# Patient Record
Sex: Male | Born: 1960 | Race: White | Hispanic: No | Marital: Married | Smoking: Former smoker
Health system: Southern US, Community
[De-identification: ages and names within clinical notes are randomized; demographics above are authoritative.]

## PROBLEM LIST (undated history)

## (undated) DIAGNOSIS — I2510 Atherosclerotic heart disease of native coronary artery without angina pectoris: Secondary | ICD-10-CM

## (undated) DIAGNOSIS — I10 Essential (primary) hypertension: Secondary | ICD-10-CM

## (undated) DIAGNOSIS — I4891 Unspecified atrial fibrillation: Secondary | ICD-10-CM

## (undated) HISTORY — PX: CARDIAC CATHETERIZATION: SHX172

---

## 2008-01-03 ENCOUNTER — Emergency Department (HOSPITAL_BASED_OUTPATIENT_CLINIC_OR_DEPARTMENT_OTHER): Admission: EM | Admit: 2008-01-03 | Discharge: 2008-01-03 | Payer: Self-pay | Admitting: Emergency Medicine

## 2008-02-25 ENCOUNTER — Ambulatory Visit (HOSPITAL_COMMUNITY): Admission: RE | Admit: 2008-02-25 | Discharge: 2008-02-25 | Payer: Self-pay | Admitting: Urology

## 2010-05-08 ENCOUNTER — Inpatient Hospital Stay (HOSPITAL_COMMUNITY): Admission: EM | Admit: 2010-05-08 | Discharge: 2010-05-10 | Payer: Self-pay | Admitting: Emergency Medicine

## 2010-05-09 ENCOUNTER — Encounter (INDEPENDENT_AMBULATORY_CARE_PROVIDER_SITE_OTHER): Payer: Self-pay | Admitting: Cardiology

## 2010-07-26 ENCOUNTER — Encounter: Payer: Self-pay | Admitting: Urology

## 2010-09-14 LAB — CBC
HCT: 50.4 % (ref 39.0–52.0)
HCT: 51 % (ref 39.0–52.0)
MCH: 29.7 pg (ref 26.0–34.0)
MCHC: 35.7 g/dL (ref 30.0–36.0)
MCHC: 33.7 g/dL (ref 30.0–36.0)
Platelets: 222 10*3/uL (ref 150–400)
Platelets: 228 10*3/uL (ref 150–400)
Platelets: 196 10*3/uL (ref 150–400)
Platelets: 218 10*3/uL (ref 150–400)
RBC: 5.8 MIL/uL (ref 4.22–5.81)
RBC: 5.64 MIL/uL (ref 4.22–5.81)
RDW: 13 % (ref 11.5–15.5)
RDW: 13 % (ref 11.5–15.5)
RDW: 13.2 % (ref 11.5–15.5)
RDW: 13.3 % (ref 11.5–15.5)
WBC: 10.5 10*3/uL (ref 4.0–10.5)
WBC: 10.7 10*3/uL — ABNORMAL HIGH (ref 4.0–10.5)
WBC: 10.4 10*3/uL (ref 4.0–10.5)

## 2010-09-14 LAB — TSH: TSH: 1.575 u[IU]/mL (ref 0.350–4.500)

## 2010-09-14 LAB — COMPREHENSIVE METABOLIC PANEL
ALT: 23 U/L (ref 0–53)
Albumin: 4.4 g/dL (ref 3.5–5.2)
Alkaline Phosphatase: 72 U/L (ref 39–117)
Calcium: 9.9 mg/dL (ref 8.4–10.5)
Glucose, Bld: 105 mg/dL — ABNORMAL HIGH (ref 70–99)
Potassium: 4.6 mEq/L (ref 3.5–5.1)
Sodium: 139 mEq/L (ref 135–145)
Total Protein: 7.4 g/dL (ref 6.0–8.3)

## 2010-09-14 LAB — DIFFERENTIAL
Basophils Absolute: 0.1 10*3/uL (ref 0.0–0.1)
Eosinophils Absolute: 0.1 10*3/uL (ref 0.0–0.7)
Eosinophils Relative: 1 % (ref 0–5)
Neutrophils Relative %: 69 % (ref 43–77)

## 2010-09-14 LAB — BASIC METABOLIC PANEL
BUN: 10 mg/dL (ref 6–23)
BUN: 11 mg/dL (ref 6–23)
CO2: 28 mEq/L (ref 19–32)
Calcium: 9.1 mg/dL (ref 8.4–10.5)
Calcium: 9.1 mg/dL (ref 8.4–10.5)
Creatinine, Ser: 1.15 mg/dL (ref 0.4–1.5)
Creatinine, Ser: 1.26 mg/dL (ref 0.4–1.5)
GFR calc Af Amer: >60 mL/min (ref >60)
Glucose, Bld: 102 mg/dL — ABNORMAL HIGH (ref 70–99)

## 2010-09-14 LAB — BRAIN NATRIURETIC PEPTIDE: Pro B Natriuretic peptide (BNP): <30 pg/mL (ref 0.0–100.0)

## 2010-09-14 LAB — LIPID PANEL
HDL: 31 mg/dL — ABNORMAL LOW (ref >39)
Total CHOL/HDL Ratio: 4.7 RATIO
Triglycerides: 443 mg/dL — ABNORMAL HIGH (ref <150)

## 2010-09-14 LAB — HEPARIN LEVEL (UNFRACTIONATED)
Heparin Unfractionated: <0.1 IU/mL — ABNORMAL LOW (ref 0.30–0.70)
Heparin Unfractionated: <0.1 IU/mL — ABNORMAL LOW (ref 0.30–0.70)

## 2010-09-14 LAB — CK TOTAL AND CKMB (NOT AT ARMC)
CK, MB: 27.6 ng/mL (ref 0.3–4.0)
CK, MB: 96.1 ng/mL (ref 0.3–4.0)
Relative Index: 11.6 — ABNORMAL HIGH (ref 0.0–2.5)
Relative Index: 12.3 — ABNORMAL HIGH (ref 0.0–2.5)
Total CK: 585 U/L — ABNORMAL HIGH (ref 7–232)

## 2010-09-14 LAB — HEMOGLOBIN A1C
Hgb A1c MFr Bld: 4.9 % (ref <5.7)
Mean Plasma Glucose: 94 mg/dL (ref <117)

## 2010-09-14 LAB — MRSA PCR SCREENING: MRSA by PCR: NEGATIVE

## 2010-09-14 LAB — POCT CARDIAC MARKERS
Myoglobin, poc: 322 ng/mL (ref 12–200)
Troponin i, poc: 0.42 ng/mL (ref 0.00–0.09)

## 2010-09-14 LAB — TROPONIN I: Troponin I: 15.39 ng/mL (ref 0.00–0.06)

## 2011-03-31 LAB — URINE CULTURE
Colony Count: NO GROWTH
Culture: NO GROWTH

## 2011-03-31 LAB — URINALYSIS, ROUTINE W REFLEX MICROSCOPIC
Bilirubin Urine: NEGATIVE
Nitrite: NEGATIVE
Protein, ur: 100 — AB
Specific Gravity, Urine: 1.023
Urobilinogen, UA: 1

## 2011-03-31 LAB — BASIC METABOLIC PANEL
CO2: 24
Calcium: 9.6
Chloride: 108
GFR calc Af Amer: >60
Glucose, Bld: 83
Sodium: 141

## 2011-03-31 LAB — URINE MICROSCOPIC-ADD ON

## 2011-10-31 IMAGING — CT CT ANGIO CHEST
3 of 7 series · 18 of 46 positions shown · IV contrast (APPLIED)
Comparison: CT abdomen dated 01/03/2008

Addendum Begins

The study also demonstrates a single segment of focal calcified
coronary artery plaque in the proximal LAD.
Addendum Ends
CLINICAL DATA: Severe chest pain and significant hypertension.
CT ANGIOGRAPHY CHEST WITH CONTRAST
TECHNIQUE: Multidetector CT imaging of the chest was performed
using the standard protocol during bolus administration of
intravenous contrast.  Multiplanar CT image reconstructions
including MIPs were obtained to evaluate the vascular anatomy.
Contrast:  120 ml Omnipaque 350 IV

[Series 5: dissection 2.0 st · axial · 0.77mm/px · z∈[-348,-60]mm · 12 of 172 slices shown]
[im 14/172  lung]
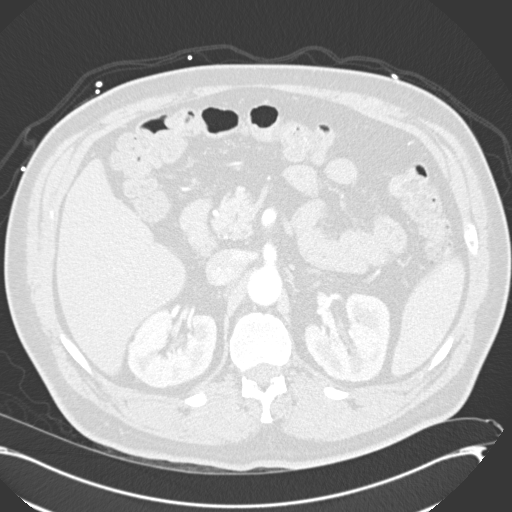
[im 27/172  soft-tissue]
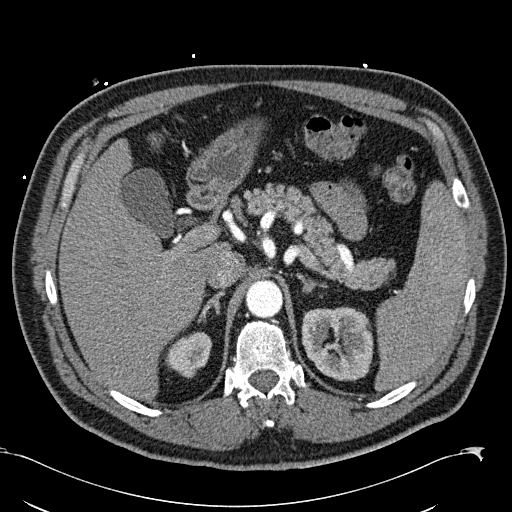
[im 40/172  lung]
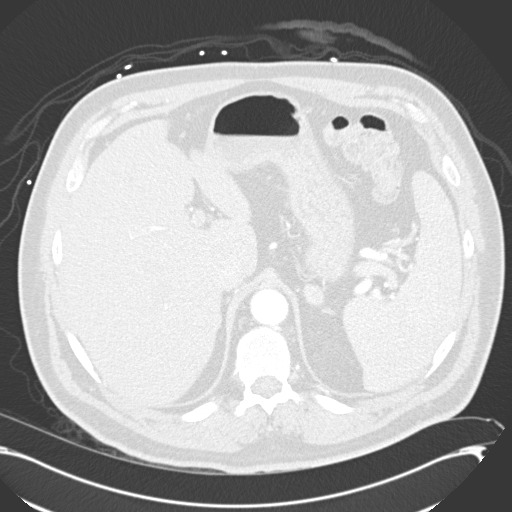
[im 53/172  soft-tissue]
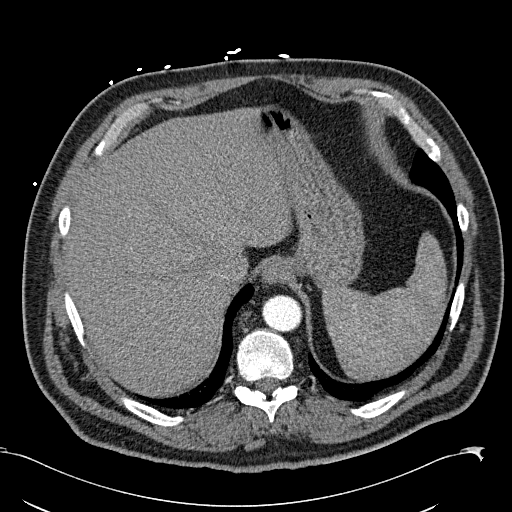
[im 66/172  lung]
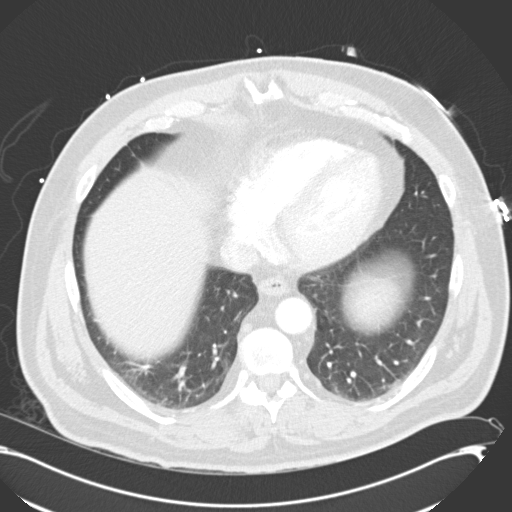
[im 79/172  soft-tissue]
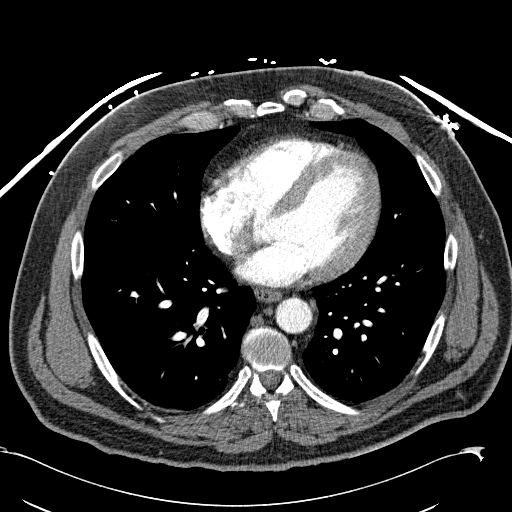
[im 93/172  lung]
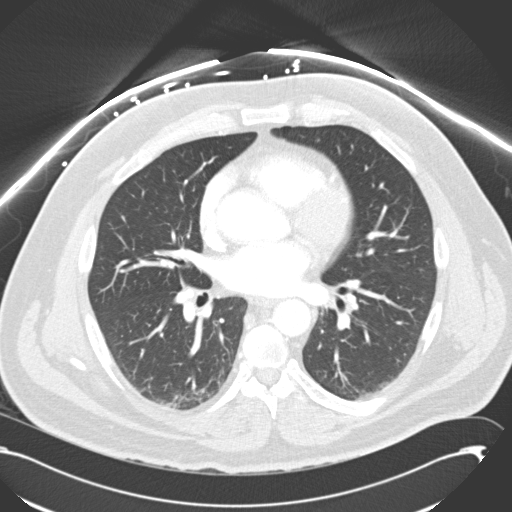
[im 106/172  soft-tissue]
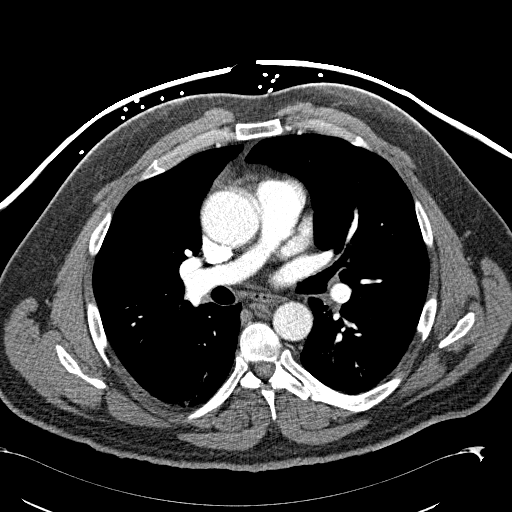
[im 119/172  lung]
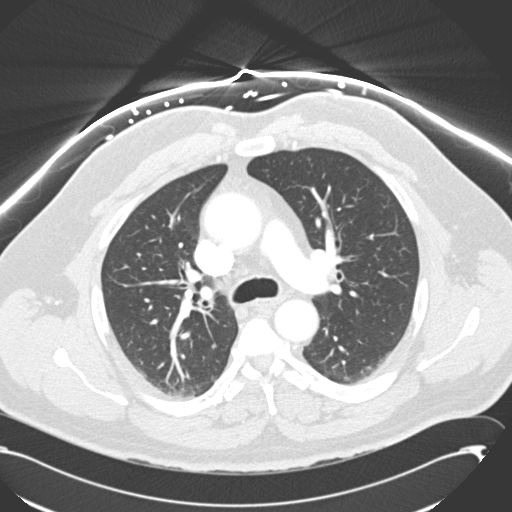
[im 132/172  soft-tissue]
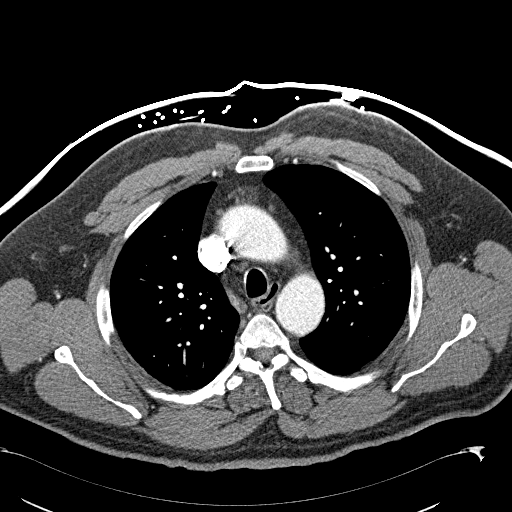
[im 145/172  lung]
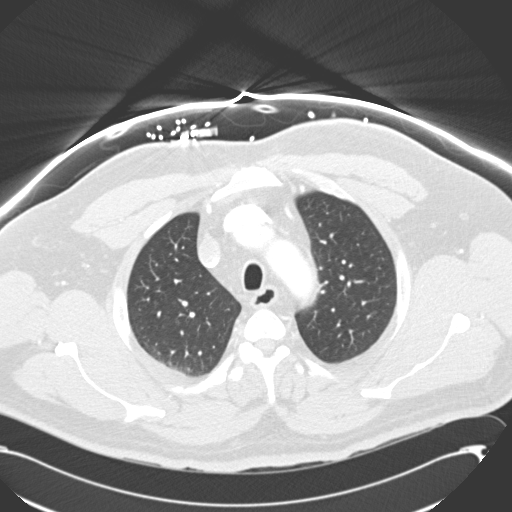
[im 158/172  soft-tissue]
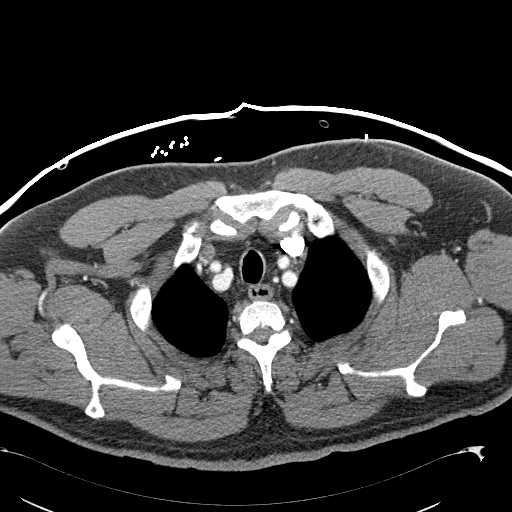

[Series 6: dissection 2.0 lung · axial · 0.77mm/px · z∈[-292,-214]mm · 3 of 143 slices shown]
[im 13/143  soft-tissue]
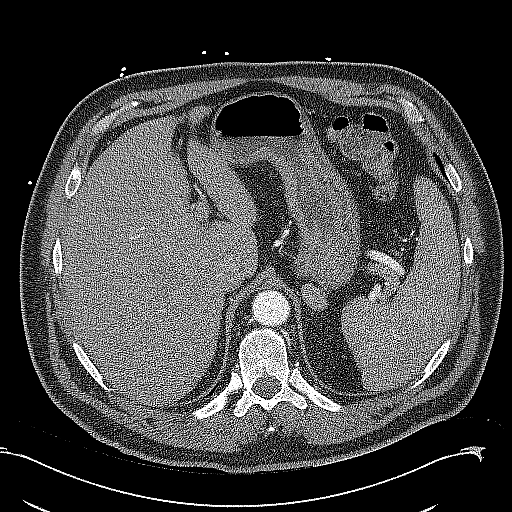
[im 26/143  soft-tissue]
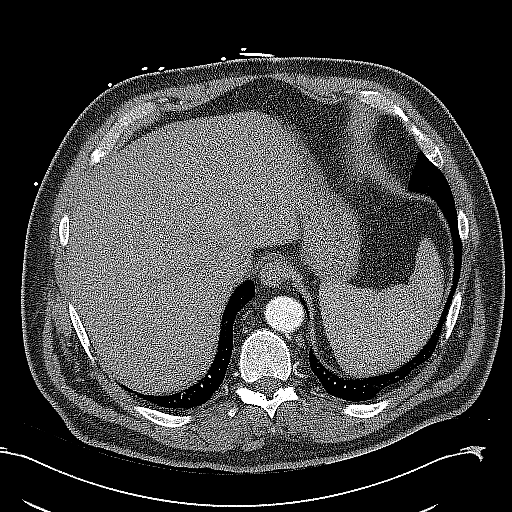
[im 52/143  soft-tissue]
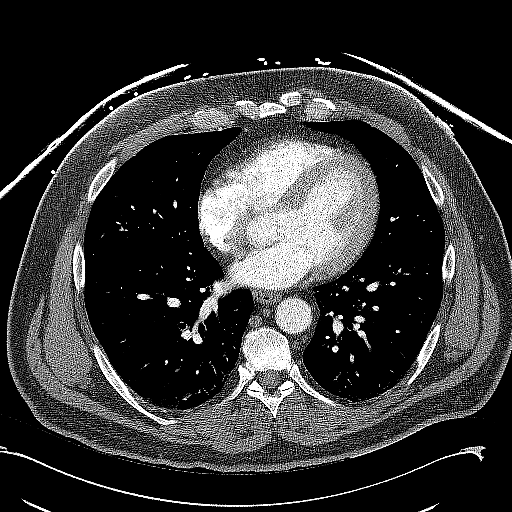

[Series 602: cor mpr · coronal · 0.77mm/px · 3 of 141 slices shown]
[im 36/141  soft-tissue]
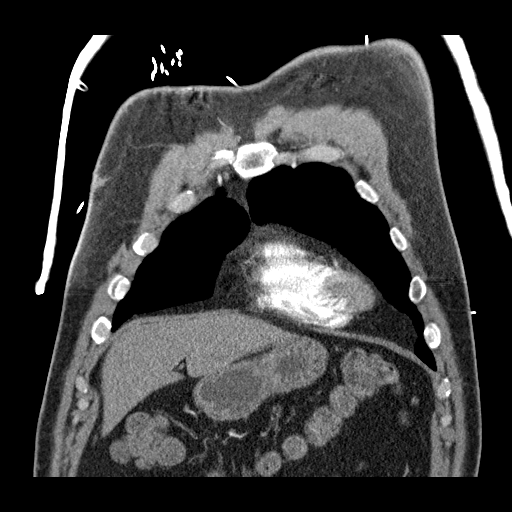
[im 71/141  soft-tissue]
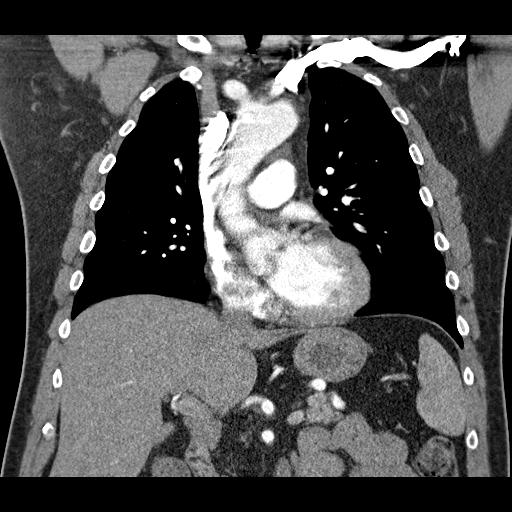
[im 106/141  soft-tissue]
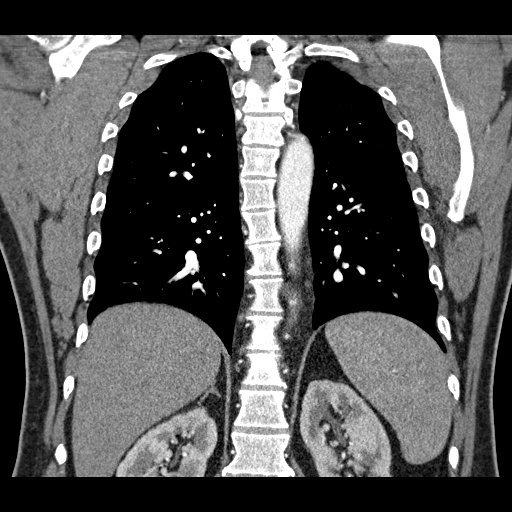

[18 of 46 positions shown; findings below may reference images not displayed]

FINDINGS: There is mild aneurysmal dilatation of the ascending
thoracic aorta just beyond the sinotubular junction and extending
to the proximal arch.  Maximal diameter is approximately 4.4 cm.
No evidence of associated aortic dissection or intramural hematoma.
The aortic arch and descending thoracic aorta show no evidence of
dilatation or pathology.

Proximal great vessels show normal patency.  Pulmonary arteries are
also adequately opacified and demonstrate no evidence of pulmonary
embolism.  No pleural or pericardial fluid.

Small amount of thymic remnant tissue present in the anterior
mediastinum.  No evidence of enlarged lymph nodes in the chest.
The visualized upper abdomen shows an ovoid soft tissue structure
between the crus of the left hemidiaphragm and the gastric cardia
measuring approximately 1.7 x 2.6 cm.  This has a stable appearance
when compared to the prior CT in January 2008.  This is clearly
separable from the left adrenal gland which lies just inferior to
the soft tissue nodule.  Part of the nodule does  come into contact
with the serosal surface of the stomach and this may actually
originate from the gastric wall.  The nodule shows contrast
enhancement with precontrast density measurement of the 30 HU and
post contrast density of 72 HU.  Given lack of change, this is
likely a benign nodule and may represent lymph node or lesion
originating in an exophytic fashion from the stomach such as a a
leiomyoma.  Recommend additional elective follow-up to exclude
growth over time.

Review of the MIP images confirms the above findings.
IMPRESSION: 1.  No evidence of thoracic aortic dissection or pulmonary
embolism.  The ascending thoracic aorta shows mild aneurysmal
dilatation with maximal diameter of 4.4 cm.
2.  Incidental soft tissue nodule in the upper abdomen on the left
which may be originating from the posterior serosal surface of the
proximal stomach.  This is likely benign given no evidence of
change over 2 years.  The nodule does enhance after contrast
administration.  Recommend elective follow-up to make sure there is
no change in the lesion.

## 2013-10-15 NOTE — Progress Notes (Signed)
 Referring Physician: Corrington, Kip A, MD   Chief Complaint  Patient presents with   Chest Pain   Hypertension   Coronary Artery Disease    MI and stenting approx. 2000 at [facility].    HPI: Patient is a 53 year old man who for financial reasons and lack of insurance had not been following with physicians for a number of years. Strip ischemic heart disease status post myocardial infarction and stent placement. The patient is unsure of the year in which she had suffered his heart attack. However was a number of years ago. He was treated at [facility]. He does not have any stent card. He thinks the heart attack involving the back of the heart. He is unsure whether he had one or 2 stents placed. We do not have any outside records at this time to review although I asked the patient to sign a release of medical information form that we may obtain records regarding his prior cardiac treatment.  The patient had recently seen Dr. Bobbette at [facility] for his blood pressure was quite high measuring 178/102 Mill meters mercury. She also describes some intermittent chest pain. Patient was referred to our office as results of that for further evaluation and treatment. The patient is accompanied by his wife at today's office visit. He describes the chest discomfort as a tightness in the center of his chest lasting about 10 minutes. She has not had any stress testing since his PCI. He was on no medications until last month. He is having chest pain daily now. He is a bit vague about what exactly the taste discomfort. There is no history of renal dysfunction. Blood work on 10/01/2013 identified serum creatinine of 1.10 with a potassium of 4.6. The patient is not currently on any aspirin . He also is on no cholesterol-lowering medicine but his LDL cholesterol last month was only 80 g/dL with a triglyceride level of 222 and HDL cholesterol level of 36 mg/dL. The  patient is also not anemic. His recent hemoglobin measured 17.1 g/dL.  The patient's wife says that her husband snores a lot reading seems to stop for several seconds repeatedly during the night followed by loud. She may be describing periods of sleep apnea. The patient has not had any sleep study evaluation.  Patient has No Known Allergies.  Patient Active Problem List  Diagnosis   CAD (coronary artery disease)   Hypertension    Past Medical History Patient  has a past medical history of Heart attack and Coronary artery disease.  Past Surgical History Patient  has past surgical history that includes Femoral artery stent (05/08/2010) and Cardiac catheterization.  Social History Patient  reports that he quit smoking about 5 months ago. He has never used smokeless tobacco. He reports that he does not drink alcohol or use illicit drugs.  Family History Patient's family history includes Arthritis in his mother; Heart attack in his mother; Heart failure in his mother; Hypertension in his mother; Rheumatic fever in his mother; Stroke in his mother.  I have reviewed the new patient form.     Medication Sig Dispense Refill   carvedilol (COREG) 12.5 mg tablet Take 1 tablet (12.5 mg total) by mouth 3 (three) times a day.  90 tablet  11   nitroGLYCERIN  (NITROSTAT ) 0.4 MG SL tablet Place 1 tablet (0.4 mg total) under the tongue every 5 (five) minutes as needed for Chest pain.  100 tablet  3   aspirin  81  mg EC tablet Take 1 tablet (81 mg total) by mouth daily.  100 tablet  2   lisinopril  (PRINIVIL ,ZESTRIL ) 10 mg tablet Take 1 tablet (10 mg total) by mouth daily.  30 tablet  11   No current facility-administered medications for this visit.     ROS: Please see reviewed patient completed review of systems form that has been scanned into the record.  OBJECTIVE:  BP 164/112  Pulse 70  Ht 5' 11.5 (1.816 m)  Wt 280 lb 8 oz (127.234 kg)  BMI 38.58 kg/m2  Physical Exam:The patient is  overweight and in no distress. Pupils equal round reactive light and extraocular movements intact. No thyromegaly. No JVD. No carotid bruits.  Cardiovascular exam reveals RRR. No murmur. No rub. No gallop.  Lungs auscultation: Clear bilaterally.  Abdomen: Soft, non-tender. NABS. No palpable mass or bruit present. Extremities: No edema. No cyanosis. Pulses: 2+ and intact bilaterally (radial and dorsalis pedis). Skin reveals no rashes.  Neurological exam: cranial nerves are intact with no focal deficits.  Psychiatric exam: alert and oriented to person place and time.  Ecg 12 Lead  10/15/2013   Sinus  Rhythm  Incomplete right bundle branch block.  Old inferior infarct.   ABNORMAL    Lab Results  Component Value Date   CHOL 161 10/01/2013   CHOL 159 10/01/2013   HDL 36* 10/01/2013   HDL 35* 10/01/2013   HDL 26* 04/30/2012   LDL 80 10/01/2013   LDL 80 6/68/7984   LDL 75 89/71/7986   TRIG 224* 10/01/2013   TRIG 222* 10/01/2013   WBC 7.6 10/01/2013   WBC CANCELED 10/01/2013   HGB 17.1 10/01/2013   HGB CANCELED 10/01/2013   PLT 204 10/01/2013   PLT CANCELED 10/01/2013   CREATININE 1.10 10/01/2013   CREATININE 1.10 10/01/2013   CREATININE 1.00 05/01/2012   BUN 18 10/01/2013   BUN 17 10/01/2013   BUN 17 05/01/2012   NA 141 10/01/2013   NA 142 10/01/2013   K 4.6 10/01/2013   K 4.5 10/01/2013   CL 100 10/01/2013   CL 101 10/01/2013   CO2 23 10/01/2013   CO2 22 10/01/2013   CO2 24 05/01/2012   INR 1.0 04/29/2012   TSH 0.965 10/01/2013   TSH 1.100 10/01/2013   ALT 19 10/01/2013   ALT 22 10/01/2013   AST 15 10/01/2013   AST 15 10/01/2013   ALKPHOS 71 10/01/2013   ALKPHOS 73 10/01/2013   BILITOT 0.4 10/01/2013   BILITOT 0.4 10/01/2013    Assessment: 1. CAD (coronary artery disease)   2. Hypertension   3. Old MI (myocardial infarction)   4. Angina pectoris syndrome   5.   Possible positive sleep apnea  Plan: Patient appears to be describing angina pectoris. Symptoms have progressed to daily  occurrences. I recommend evaluation coronary angiography. We discussed the procedure. I explained the risks. I answered all questions. Patient is agreeable to proceed. In the meantime he'll increase his carvedilol to 12.5 mg 3 times a day with meals and begin lisinopril  10 mg daily for better blood pressure management. In addition, the patient will begin taking daily aspirin  today. Later he will need referral to [facility] chest for evaluation of possible obstructive sleep apnea. Health Maintenance issues including appropriate healthy diet, exercise and smoking avoidance were discussed with pt.   Orders Placed This Encounter  Procedures   ECG 12 lead   Patient's Medications  New Prescriptions   ASPIRIN  81 MG EC TABLET  Take 1 tablet (81 mg total) by mouth daily.   LISINOPRIL  (PRINIVIL ,ZESTRIL ) 10 MG TABLET    Take 1 tablet (10 mg total) by mouth daily.  Previous Medications   NITROGLYCERIN  (NITROSTAT ) 0.4 MG SL TABLET    Place 1 tablet (0.4 mg total) under the tongue every 5 (five) minutes as needed for Chest pain.  Modified Medications   Modified Medication Previous Medication   CARVEDILOL (COREG) 12.5 MG TABLET carvedilol (COREG) 12.5 mg tablet      Take 1 tablet (12.5 mg total) by mouth 3 (three) times a day.    Take 1 tablet (12.5 mg total) by mouth 2 (two) times daily.  Discontinued Medications   No medications on file

## 2021-10-17 ENCOUNTER — Other Ambulatory Visit: Payer: Self-pay

## 2021-10-17 ENCOUNTER — Emergency Department (HOSPITAL_BASED_OUTPATIENT_CLINIC_OR_DEPARTMENT_OTHER): Payer: Medicaid Other

## 2021-10-17 ENCOUNTER — Encounter (HOSPITAL_BASED_OUTPATIENT_CLINIC_OR_DEPARTMENT_OTHER): Payer: Self-pay | Admitting: Emergency Medicine

## 2021-10-17 ENCOUNTER — Emergency Department (HOSPITAL_BASED_OUTPATIENT_CLINIC_OR_DEPARTMENT_OTHER)
Admission: EM | Admit: 2021-10-17 | Discharge: 2021-10-17 | Disposition: A | Discharge status: Home / Self Care | Payer: Medicaid Other | Attending: Emergency Medicine | Admitting: Emergency Medicine

## 2021-10-17 ENCOUNTER — Other Ambulatory Visit (HOSPITAL_BASED_OUTPATIENT_CLINIC_OR_DEPARTMENT_OTHER): Payer: Self-pay

## 2021-10-17 DIAGNOSIS — Y9367 Activity, basketball: Secondary | ICD-10-CM | POA: Diagnosis not present

## 2021-10-17 DIAGNOSIS — S060X1A Concussion with loss of consciousness of 30 minutes or less, initial encounter: Secondary | ICD-10-CM

## 2021-10-17 DIAGNOSIS — Z23 Encounter for immunization: Secondary | ICD-10-CM | POA: Insufficient documentation

## 2021-10-17 DIAGNOSIS — S0240CA Maxillary fracture, right side, initial encounter for closed fracture: Secondary | ICD-10-CM | POA: Diagnosis not present

## 2021-10-17 DIAGNOSIS — S60511A Abrasion of right hand, initial encounter: Secondary | ICD-10-CM | POA: Diagnosis not present

## 2021-10-17 DIAGNOSIS — S02401A Maxillary fracture, unspecified, initial encounter for closed fracture: Secondary | ICD-10-CM

## 2021-10-17 DIAGNOSIS — W01198A Fall on same level from slipping, tripping and stumbling with subsequent striking against other object, initial encounter: Secondary | ICD-10-CM | POA: Diagnosis not present

## 2021-10-17 DIAGNOSIS — Z955 Presence of coronary angioplasty implant and graft: Secondary | ICD-10-CM

## 2021-10-17 DIAGNOSIS — S0240EA Zygomatic fracture, right side, initial encounter for closed fracture: Secondary | ICD-10-CM | POA: Diagnosis not present

## 2021-10-17 DIAGNOSIS — I214 Non-ST elevation (NSTEMI) myocardial infarction: Secondary | ICD-10-CM

## 2021-10-17 DIAGNOSIS — S0993XA Unspecified injury of face, initial encounter: Secondary | ICD-10-CM | POA: Diagnosis present

## 2021-10-17 DIAGNOSIS — Z7901 Long term (current) use of anticoagulants: Secondary | ICD-10-CM | POA: Insufficient documentation

## 2021-10-17 DIAGNOSIS — I2510 Atherosclerotic heart disease of native coronary artery without angina pectoris: Secondary | ICD-10-CM | POA: Diagnosis not present

## 2021-10-17 HISTORY — DX: Unspecified atrial fibrillation: I48.91

## 2021-10-17 HISTORY — DX: Essential (primary) hypertension: I10

## 2021-10-17 HISTORY — DX: Atherosclerotic heart disease of native coronary artery without angina pectoris: I25.10

## 2021-10-17 MED ORDER — TETANUS-DIPHTH-ACELL PERTUSSIS 5-2.5-18.5 LF-MCG/0.5 IM SUSY
0.5000 mL | PREFILLED_SYRINGE | Freq: Once | INTRAMUSCULAR | Status: AC
  Administered 2021-10-17: 0.5 mL via INTRAMUSCULAR
  Filled 2021-10-17: qty 0.5

## 2021-10-17 MED ORDER — AMOXICILLIN-POT CLAVULANATE 875-125 MG PO TABS: 1.0000 | ORAL_TABLET | Freq: Two times a day (BID) | ORAL | 0 refills | Status: DC

## 2021-10-17 MED ORDER — OXYCODONE-ACETAMINOPHEN 5-325 MG PO TABS
1.0000 | ORAL_TABLET | Freq: Four times a day (QID) | ORAL | 0 refills | Status: DC | PRN
Start: 2021-10-17 — End: 2022-06-19

## 2021-10-17 MED ORDER — AMOXICILLIN-POT CLAVULANATE 875-125 MG PO TABS
1.0000 | ORAL_TABLET | Freq: Once | ORAL | Status: AC
  Administered 2021-10-17: 1 via ORAL
  Filled 2021-10-17: qty 1

## 2021-10-17 MED ORDER — OXYCODONE-ACETAMINOPHEN 5-325 MG PO TABS
2.0000 | ORAL_TABLET | Freq: Once | ORAL | Status: AC
  Administered 2021-10-17: 2 via ORAL
  Filled 2021-10-17: qty 2

## 2021-10-17 NOTE — ED Triage Notes (Signed)
Pt fell face first on concrete while playing basketball today; possible LOC; + blood thinners; RT side facial pain and abrasions ?

## 2021-10-17 NOTE — Discharge Instructions (Addendum)
You likely have a concussion but your CT head did not show any bleeding ? ?You do have a zygomatic arch fracture ? ?Please apply ice for swelling ? ?Take Tylenol for pain and Percocet for severe pain ? ?You need to take Augmentin as prescribed. ? ?Expect some swelling to the face. ? ? ?You should see ENT doctor for follow-up ? ? ?Return to ER if you have worse headaches, severe pain in your cheek, uncontrolled nosebleed, vomiting ?

## 2021-10-17 NOTE — ED Provider Notes (Signed)
?[facility] EMERGENCY DEPARTMENT ?Provider Note ? ? ?CSN: [id number] ?Arrival date & time: 10/17/21  1817 ? ?  ? ?History ? ?Chief Complaint  ?Patient presents with  ? Fall  ? Head Injury  ? ? ?Jay Ware is a 61 y.o. male history of CAD, A-fib on Eliquis here presenting with fall.  He was playing basketball with his son today. He states that the concrete was not flat and he tripped and fell and hit his head.  He may have a loss of consciousness afterwards as well. He came home afterwards and has worsening right facial pain and nosebleed.  He he also noticed bruising on his right hand.  ? ?The history is provided by the patient.  ? ?  ? ?Home Medications ?Prior to Admission medications   ?Not on File  ?   ? ?Allergies    ?Patient has no known allergies.   ? ?Review of Systems   ?Review of Systems  ?HENT:  Positive for sinus pain.   ?Neurological:  Positive for dizziness and headaches.  ?All other systems reviewed and are negative. ? ?Physical Exam ?Updated Vital Signs ?BP (!) 139/106   Pulse 81   Temp 98.7 ?F (37.1 ?C) (Oral)   Resp 20   Ht 6\' 1"  (1.854 m)   Wt (!) 140.6 kg   SpO2 95%   BMI 40.90 kg/m?  ?Physical Exam ?Vitals and nursing note reviewed.  ?Constitutional:   ?   Comments: Uncomfortable  ?HENT:  ?   Head:  ?   Comments: Abrasion about the right eyebrow.  Patient has right maxillary sinus tenderness.  Patient has dried blood in the right nostril but no obvious septal hematoma ?   Mouth/Throat:  ?   Mouth: Mucous membranes are moist.  ?Eyes:  ?   Extraocular Movements: Extraocular movements intact.  ?   Pupils: Pupils are equal, round, and reactive to light.  ?Cardiovascular:  ?   Rate and Rhythm: Normal rate and regular rhythm.  ?   Pulses: Normal pulses.  ?   Heart sounds: Normal heart sounds.  ?Pulmonary:  ?   Effort: Pulmonary effort is normal.  ?   Breath sounds: Normal breath sounds.  ?Abdominal:  ?   General: Abdomen is flat.  ?   Palpations: Abdomen is soft.  ?Musculoskeletal:   ?   Cervical back: Normal range of motion and neck supple.  ?   Comments: Abrasions of the right hand.  No obvious bony deformity.  ?Skin: ?   General: Skin is warm.  ?   Capillary Refill: Capillary refill takes less than 2 seconds.  ?Neurological:  ?   General: No focal deficit present.  ?   Mental Status: He is oriented to person, place, and time.  ?Psychiatric:     ?   Mood and Affect: Mood normal.     ?   Behavior: Behavior normal.  ? ? ?ED Results / Procedures / Treatments   ?Labs ?(all labs ordered are listed, but only abnormal results are displayed) ?Labs Reviewed - No data to display ? ?EKG ?None ? ?Radiology ?No results found. ? ?Procedures ?Procedures  ? ? ?Medications Ordered in ED ?Medications  ?Tdap (BOOSTRIX) injection 0.5 mL (0.5 mLs Intramuscular Given 10/17/21 1859)  ?oxyCODONE-acetaminophen (PERCOCET/ROXICET) 5-325 MG per tablet 2 tablet (2 tablets Oral Given 10/17/21 1857)  ? ? ?ED Course/ Medical Decision Making/ A&P ?  ?                        ?  Medical Decision Making ?Jay Ware is a 61 y.o. male here presenting with fall with possible loss of consciousness.  Patient is on Eliquis.  We will get CT head neck and face.  I also ordered right hand x-ray but patient states that he does not think anything is broken so does not want to get the x-ray.  ? ?8:14 PM ?CT showed acute fractures involving anterior and posterior wall of the right maxillary sinus and floor and lateral wall of the right orbit and zygomatic arch.  Patient pain is improved with Percocet.  Will give Augmentin.  Will refer to ENT outpatient.  ? ?Problems Addressed: ?Closed fracture of maxillary sinus, initial encounter [facility]): acute illness or injury ?Closed fracture of right zygomatic arch, initial encounter [facility]): acute illness or injury ? ?Amount and/or Complexity of Data Reviewed ?Labs: ordered. Decision-making details documented in ED Course. ?Radiology: ordered and independent interpretation performed. Decision-making  details documented in ED Course. ? ?Risk ?Prescription drug management. ? ? ?Final Clinical Impression(s) / ED Diagnoses ?Final diagnoses:  ?None  ? ? ?Rx / DC Orders ?ED Discharge Orders   ? ? None  ? ?  ? ? ?  ?Charlynne Pander, MD ?10/17/21 2016 ? ?

## 2022-04-20 ENCOUNTER — Emergency Department (HOSPITAL_BASED_OUTPATIENT_CLINIC_OR_DEPARTMENT_OTHER)
Admission: EM | Admit: 2022-04-20 | Discharge: 2022-04-20 | Disposition: A | Discharge status: Home / Self Care | Payer: Medicaid Other | Attending: Emergency Medicine | Admitting: Emergency Medicine

## 2022-04-20 ENCOUNTER — Encounter (HOSPITAL_BASED_OUTPATIENT_CLINIC_OR_DEPARTMENT_OTHER): Payer: Self-pay | Admitting: Emergency Medicine

## 2022-04-20 ENCOUNTER — Other Ambulatory Visit: Payer: Self-pay

## 2022-04-20 DIAGNOSIS — M545 Low back pain, unspecified: Secondary | ICD-10-CM | POA: Insufficient documentation

## 2022-04-20 MED ORDER — LIDOCAINE 5 % EX PTCH
1.0000 | MEDICATED_PATCH | CUTANEOUS | Status: DC
  Administered 2022-04-20: 1 via TRANSDERMAL
  Filled 2022-04-20: qty 1

## 2022-04-20 MED ORDER — LIDOCAINE 5 % EX PTCH: 1.0000 | MEDICATED_PATCH | CUTANEOUS | 0 refills | Status: DC

## 2022-04-20 MED ORDER — ACETAMINOPHEN 325 MG PO TABS
650.0000 mg | ORAL_TABLET | Freq: Once | ORAL | Status: AC
  Administered 2022-04-20: 650 mg via ORAL
  Filled 2022-04-20: qty 2

## 2022-04-20 MED ORDER — OXYCODONE-ACETAMINOPHEN 5-325 MG PO TABS
1.0000 | ORAL_TABLET | Freq: Once | ORAL | Status: AC
  Administered 2022-04-20: 1 via ORAL
  Filled 2022-04-20: qty 1

## 2022-04-20 MED ORDER — METHOCARBAMOL 500 MG PO TABS: 500.0000 mg | ORAL_TABLET | Freq: Two times a day (BID) | ORAL | 0 refills | Status: DC

## 2022-04-20 NOTE — ED Notes (Signed)
ED Provider at bedside. 

## 2022-04-20 NOTE — ED Provider Notes (Signed)
[facility] EMERGENCY DEPARTMENT Provider Note   CSN: [id number] Arrival date & time: 04/20/22  1946     History  Chief Complaint  Patient presents with   Back Pain    Jay Ware is a 61 y.o. male.   Back Pain  Hx of back pain - 2 years seems to be off and on generally.  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  He states his pain started soon after resuming his job activities and has been persistent over the past week.  He denies any trauma to his back or recent falls.  History without symptoms of urinary or stool retention or incontinence, neurologic changes such as sensation change or weakness lower extremities, is not elderly or with history of osteoporosis, denies any history of cancer, fever, IV drug use, weight changes (unexplained), or prolonged steroid use.   Denies any chest pain or abdominal pain     Home Medications Prior to Admission medications   Medication Sig Start Date End Date Taking? Authorizing Provider  amoxicillin-clavulanate (AUGMENTIN) 875-125 MG tablet Take 1 tablet by mouth 2 (two) times daily. One po bid x 7 days 10/17/21   Drenda Freeze, MD  oxyCODONE-acetaminophen (PERCOCET/ROXICET) 5-325 MG tablet Take 1 tablet by mouth every 6 (six) hours as needed for severe pain. 10/17/21   Drenda Freeze, MD      Allergies    Patient has no known allergies.    Review of Systems   Review of Systems  Musculoskeletal:  Positive for back pain.    Physical Exam Updated Vital Signs BP 111/74 (BP Location: Left Arm)   Pulse (!) 107   Temp 98.7 F (37.1 C) (Oral)   Resp 18   Ht 6\' 1"  (1.854 m)   Wt (!) 140.6 kg   SpO2 95%   BMI 40.90 kg/m  Physical Exam Vitals and nursing note reviewed.  Constitutional:      General: He is not in acute distress.    Appearance: Normal appearance. He  is obese. He is not ill-appearing.  HENT:     Head: Normocephalic and atraumatic.     Mouth/Throat:     Mouth: Mucous membranes are moist.  Eyes:     General: No scleral icterus.       Right eye: No discharge.        Left eye: No discharge.     Conjunctiva/sclera: Conjunctivae normal.  Cardiovascular:     Rate and Rhythm: Normal rate.     Comments: Heart rate less than 100 on palpation Pulmonary:     Effort: Pulmonary effort is normal.     Breath sounds: No stridor.  Abdominal:     Tenderness: There is no abdominal tenderness.  Musculoskeletal:     Comments: Trigger point tenderness of the right-sided paralumbar musculature. There is no midline C, T, L-spine tenderness.  Skin:    General: Skin is warm and dry.  Neurological:     Mental Status: He is alert and oriented to person, place, and time. Mental status is at baseline.     Comments: Sensation intact in bilateral lower extremities  Psychiatric:        Mood and Affect: Mood normal.        Behavior: Behavior normal.     ED Results / Procedures / Treatments   Labs (  all labs ordered are listed, but only abnormal results are displayed) Labs Reviewed - No data to display  EKG None  Radiology No results found.  Procedures Procedures    Medications Ordered in ED Medications  oxyCODONE-acetaminophen (PERCOCET/ROXICET) 5-325 MG per tablet 1 tablet (has no administration in time range)  acetaminophen (TYLENOL) tablet 650 mg (has no administration in time range)  lidocaine (LIDODERM) 5 % 1 patch (has no administration in time range)    ED Course/ Medical Decision Making/ A&P                            Medical Decision Making Risk OTC drugs. Prescription drug management.   This patient presents to the ED for concern of back pain, this involves a number of treatment options, and is a complaint that carries with it a high risk of complications and morbidity. A differential diagnosis was considered for the  patient's symptoms which is discussed below:   The emergent differential diagnosis for back pain includes but is not limited to fracture, muscle strain, cauda equina, spinal stenosis. DDD, ankylosing spondylitis, acute ligamentous injury, disk herniation, spondylolisthesis, Epidural compression syndrome, metastatic cancer, transverse myelitis, vertebral osteomyelitis, diskitis, kidney stone, pyelonephritis, AAA, Perforated ulcer, Retrocecal appendicitis, pancreatitis, bowel obstruction, retroperitoneal hemorrhage or mass, meningitis.    Co morbidities: Discussed in HPI   Brief History:  Hx of back pain - 2 years seems to be off and on generally.  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  He states his pain started soon after resuming his job activities and has been persistent over the past week.  He denies any trauma to his back or recent falls.  History without symptoms of urinary or stool retention or incontinence, neurologic changes such as sensation change or weakness lower extremities, is not elderly or with history of osteoporosis, denies any history of cancer, fever, IV drug use, weight changes (unexplained), or prolonged steroid use.   Denies any chest pain or abdominal pain  Physical exam notable for right paralumbar muscular TTP.   EMR reviewed including pt PMHx, past surgical history and past visits to ER.   See HPI for more details   Cardiac Monitoring:  NA NA   Medicines ordered:  I ordered medication including oxycodone, tylenol, lidocaine  for pain Reevaluation of the patient after these medicines showed that the patient improved I have reviewed the patients home medicines and have made adjustments as needed   Critical Interventions:     Consults/Attending Physician      Reevaluation:  After the  interventions noted above I re-evaluated patient and found that they have :improved   Social Determinants of Health:      Problem List / ED Course:  R sided trigger point muscular tenderness.  Seems of been a chronic issue but now worsened over the past 1 week since he has been doing more heavy lifting.   Dispostion:  After consideration of the diagnostic results and the patients response to treatment, I feel that the patent would benefit from follow up with PCP. Return precautions.     Final Clinical Impression(s) / ED Diagnoses Final diagnoses:  Acute right-sided low back pain without sciatica    Rx / DC Orders ED Discharge Orders     None  Gailen Shelter, [place] 04/20/22 2038    Charlynne Pander, MD 04/20/22 [phone]

## 2022-04-20 NOTE — ED Notes (Signed)
Rx x 2 given  Written and verbal inst to pt  Verbalized an understanding  To home with spouse  

## 2022-04-20 NOTE — ED Triage Notes (Signed)
Pt is c/o lower back pain  Pt states he has had back pain for "a while" but it has been worse this week

## 2022-04-20 NOTE — Discharge Instructions (Addendum)
Please follow-up with your primary care doctor.  Use the lidocaine patches as prescribed.  I have also prescribed a muscle relaxer to use.  Warm compresses, gentle stretching, massage are helpful, warm Epsom salt soaks as well.  Your examination today is most concerning for a muscular injury 1. Medications: alternate ibuprofen and tylenol for pain control, take all usual home medications as they are prescribed 2. Treatment: rest, ice, elevate and use an ACE wrap or other compressive therapy to decrease swelling. Also drink plenty of fluids and do plenty of gentle stretching and move the affected muscle through its normal range of motion to prevent stiffness. 3. Follow Up: If your symptoms do not improve please follow up with orthopedics/sports medicine or your PCP for discussion of your diagnoses and further evaluation after today's visit; if you do not have a primary care doctor use the resource guide provided to find one; Please return to the ER for worsening symptoms or other concerns.

## 2022-06-18 ENCOUNTER — Encounter (HOSPITAL_COMMUNITY)
Admission: EM | Disposition: A | Discharge status: Home / Self Care | Payer: Self-pay | Source: Home / Self Care | Attending: Cardiology

## 2022-06-18 ENCOUNTER — Other Ambulatory Visit: Payer: Self-pay

## 2022-06-18 ENCOUNTER — Inpatient Hospital Stay (HOSPITAL_COMMUNITY)
Admission: EM | Admit: 2022-06-18 | Discharge: 2022-06-19 | DRG: 322 | Disposition: A | Discharge status: Home / Self Care | Payer: Medicaid Other | Attending: Cardiology | Admitting: Cardiology

## 2022-06-18 ENCOUNTER — Inpatient Hospital Stay (HOSPITAL_COMMUNITY): Payer: Medicaid Other

## 2022-06-18 ENCOUNTER — Emergency Department (HOSPITAL_COMMUNITY): Payer: Medicaid Other

## 2022-06-18 DIAGNOSIS — Z955 Presence of coronary angioplasty implant and graft: Secondary | ICD-10-CM | POA: Diagnosis not present

## 2022-06-18 DIAGNOSIS — N179 Acute kidney failure, unspecified: Secondary | ICD-10-CM | POA: Diagnosis present

## 2022-06-18 DIAGNOSIS — E785 Hyperlipidemia, unspecified: Secondary | ICD-10-CM | POA: Diagnosis present

## 2022-06-18 DIAGNOSIS — Z79899 Other long term (current) drug therapy: Secondary | ICD-10-CM | POA: Diagnosis not present

## 2022-06-18 DIAGNOSIS — I252 Old myocardial infarction: Secondary | ICD-10-CM | POA: Diagnosis not present

## 2022-06-18 DIAGNOSIS — Z7902 Long term (current) use of antithrombotics/antiplatelets: Secondary | ICD-10-CM | POA: Diagnosis not present

## 2022-06-18 DIAGNOSIS — I214 Non-ST elevation (NSTEMI) myocardial infarction: Secondary | ICD-10-CM | POA: Diagnosis present

## 2022-06-18 DIAGNOSIS — Z7901 Long term (current) use of anticoagulants: Secondary | ICD-10-CM

## 2022-06-18 DIAGNOSIS — G4733 Obstructive sleep apnea (adult) (pediatric): Secondary | ICD-10-CM | POA: Diagnosis present

## 2022-06-18 DIAGNOSIS — Z8249 Family history of ischemic heart disease and other diseases of the circulatory system: Secondary | ICD-10-CM

## 2022-06-18 DIAGNOSIS — I10 Essential (primary) hypertension: Secondary | ICD-10-CM | POA: Diagnosis not present

## 2022-06-18 DIAGNOSIS — Z95820 Peripheral vascular angioplasty status with implants and grafts: Secondary | ICD-10-CM

## 2022-06-18 DIAGNOSIS — F17290 Nicotine dependence, other tobacco product, uncomplicated: Secondary | ICD-10-CM | POA: Diagnosis present

## 2022-06-18 DIAGNOSIS — Z7982 Long term (current) use of aspirin: Secondary | ICD-10-CM

## 2022-06-18 DIAGNOSIS — I25110 Atherosclerotic heart disease of native coronary artery with unstable angina pectoris: Secondary | ICD-10-CM | POA: Diagnosis present

## 2022-06-18 DIAGNOSIS — J9811 Atelectasis: Secondary | ICD-10-CM | POA: Diagnosis not present

## 2022-06-18 DIAGNOSIS — I480 Paroxysmal atrial fibrillation: Secondary | ICD-10-CM | POA: Diagnosis present

## 2022-06-18 DIAGNOSIS — Z6841 Body Mass Index (BMI) 40.0 and over, adult: Secondary | ICD-10-CM | POA: Diagnosis not present

## 2022-06-18 DIAGNOSIS — I130 Hypertensive heart and chronic kidney disease with heart failure and stage 1 through stage 4 chronic kidney disease, or unspecified chronic kidney disease: Secondary | ICD-10-CM | POA: Diagnosis present

## 2022-06-18 DIAGNOSIS — I739 Peripheral vascular disease, unspecified: Secondary | ICD-10-CM | POA: Diagnosis present

## 2022-06-18 DIAGNOSIS — N182 Chronic kidney disease, stage 2 (mild): Secondary | ICD-10-CM | POA: Diagnosis present

## 2022-06-18 DIAGNOSIS — I428 Other cardiomyopathies: Secondary | ICD-10-CM | POA: Diagnosis present

## 2022-06-18 DIAGNOSIS — I5022 Chronic systolic (congestive) heart failure: Secondary | ICD-10-CM | POA: Diagnosis present

## 2022-06-18 DIAGNOSIS — I7121 Aneurysm of the ascending aorta, without rupture: Secondary | ICD-10-CM | POA: Diagnosis present

## 2022-06-18 DIAGNOSIS — I2510 Atherosclerotic heart disease of native coronary artery without angina pectoris: Secondary | ICD-10-CM

## 2022-06-18 HISTORY — PX: LEFT HEART CATH AND CORONARY ANGIOGRAPHY: CATH118249

## 2022-06-18 HISTORY — PX: CORONARY STENT INTERVENTION: CATH118234

## 2022-06-18 LAB — APTT: aPTT: 88 seconds — ABNORMAL HIGH (ref 24–36)

## 2022-06-18 LAB — BASIC METABOLIC PANEL
Anion gap: 8 (ref 5–15)
BUN: 15 mg/dL (ref 8–23)
CO2: 26 mmol/L (ref 22–32)
Calcium: 9.8 mg/dL (ref 8.9–10.3)
Chloride: 106 mmol/L (ref 98–111)
Creatinine, Ser: 1.47 mg/dL — ABNORMAL HIGH (ref 0.61–1.24)
GFR, Estimated: 54 mL/min — ABNORMAL LOW (ref >60)
Glucose, Bld: 97 mg/dL (ref 70–99)
Potassium: 4.4 mmol/L (ref 3.5–5.1)
Sodium: 140 mmol/L (ref 135–145)

## 2022-06-18 LAB — CBC
HCT: 45.2 % (ref 39.0–52.0)
Hemoglobin: 15.4 g/dL (ref 13.0–17.0)
MCH: 29 pg (ref 26.0–34.0)
MCHC: 34.1 g/dL (ref 30.0–36.0)
MCV: 85.1 fL (ref 80.0–100.0)
Platelets: 241 10*3/uL (ref 150–400)
RBC: 5.31 MIL/uL (ref 4.22–5.81)
RDW: 13.5 % (ref 11.5–15.5)
WBC: 9.3 10*3/uL (ref 4.0–10.5)
nRBC: 0 % (ref 0.0–0.2)

## 2022-06-18 LAB — LIPID PANEL
Cholesterol: 78 mg/dL (ref 0–200)
HDL: 32 mg/dL — ABNORMAL LOW (ref >40)
LDL Cholesterol: 33 mg/dL (ref 0–99)
Total CHOL/HDL Ratio: 2.4 RATIO
Triglycerides: 67 mg/dL (ref <150)
VLDL: 13 mg/dL (ref 0–40)

## 2022-06-18 LAB — ECHOCARDIOGRAM COMPLETE
Area-P 1/2: 3.39 cm2
Calc EF: 48.3 %
Height: 73 in
S' Lateral: 3.45 cm
Single Plane A2C EF: 60.4 %
Single Plane A4C EF: 45 %
Weight: 4880 oz

## 2022-06-18 LAB — POCT ACTIVATED CLOTTING TIME
Activated Clotting Time: 212 seconds
Activated Clotting Time: 260 seconds
Activated Clotting Time: 271 seconds

## 2022-06-18 LAB — TSH: TSH: 2.233 u[IU]/mL (ref 0.350–4.500)

## 2022-06-18 LAB — TROPONIN I (HIGH SENSITIVITY)
Troponin I (High Sensitivity): 3257 ng/L (ref <18)
Troponin I (High Sensitivity): 7811 ng/L (ref <18)

## 2022-06-18 LAB — BRAIN NATRIURETIC PEPTIDE: B Natriuretic Peptide: 17.6 pg/mL (ref 0.0–100.0)

## 2022-06-18 LAB — HIV ANTIBODY (ROUTINE TESTING W REFLEX): HIV Screen 4th Generation wRfx: NONREACTIVE

## 2022-06-18 SURGERY — LEFT HEART CATH AND CORONARY ANGIOGRAPHY
Anesthesia: LOCAL

## 2022-06-18 MED ORDER — CLOPIDOGREL BISULFATE 300 MG PO TABS
ORAL_TABLET | ORAL | Status: DC | PRN
  Administered 2022-06-18: 300 mg via ORAL

## 2022-06-18 MED ORDER — FENTANYL CITRATE PF 50 MCG/ML IJ SOSY
100.0000 ug | PREFILLED_SYRINGE | Freq: Once | INTRAMUSCULAR | Status: AC
  Administered 2022-06-18: 100 ug via INTRAVENOUS
  Filled 2022-06-18: qty 2

## 2022-06-18 MED ORDER — SODIUM CHLORIDE 0.9% FLUSH: 3.0000 mL | INTRAVENOUS | Status: DC | PRN

## 2022-06-18 MED ORDER — ACETAMINOPHEN 325 MG PO TABS: 650.0000 mg | ORAL_TABLET | ORAL | Status: DC | PRN

## 2022-06-18 MED ORDER — LIDOCAINE HCL (PF) 1 % IJ SOLN
INTRAMUSCULAR | Status: DC | PRN
  Administered 2022-06-18: 5 mL via INTRADERMAL

## 2022-06-18 MED ORDER — ONDANSETRON HCL 4 MG/2ML IJ SOLN: 4.0000 mg | Freq: Four times a day (QID) | INTRAMUSCULAR | Status: DC | PRN

## 2022-06-18 MED ORDER — ASPIRIN 81 MG PO CHEW: 324.0000 mg | CHEWABLE_TABLET | Freq: Once | ORAL | Status: DC

## 2022-06-18 MED ORDER — MIDAZOLAM HCL 2 MG/2ML IJ SOLN
INTRAMUSCULAR | Status: DC | PRN
  Administered 2022-06-18 (×2): 1 mg via INTRAVENOUS

## 2022-06-18 MED ORDER — ASPIRIN 81 MG PO CHEW: 81.0000 mg | CHEWABLE_TABLET | ORAL | Status: DC

## 2022-06-18 MED ORDER — HEPARIN SODIUM (PORCINE) 1000 UNIT/ML IJ SOLN
INTRAMUSCULAR | Status: AC
  Filled 2022-06-18: qty 10

## 2022-06-18 MED ORDER — HEPARIN SODIUM (PORCINE) 1000 UNIT/ML IJ SOLN
INTRAMUSCULAR | Status: DC | PRN
  Administered 2022-06-18: 2000 [IU] via INTRAVENOUS
  Administered 2022-06-18: 5000 [IU] via INTRAVENOUS
  Administered 2022-06-18: 3000 [IU] via INTRAVENOUS
  Administered 2022-06-18: 5000 [IU] via INTRAVENOUS

## 2022-06-18 MED ORDER — PERFLUTREN LIPID MICROSPHERE
1.0000 mL | INTRAVENOUS | Status: AC | PRN
  Administered 2022-06-18: 2 mL via INTRAVENOUS

## 2022-06-18 MED ORDER — CLOPIDOGREL BISULFATE 300 MG PO TABS
ORAL_TABLET | ORAL | Status: AC
  Filled 2022-06-18: qty 1

## 2022-06-18 MED ORDER — ASPIRIN 325 MG PO TABS
325.0000 mg | ORAL_TABLET | Freq: Every day | ORAL | Status: DC
  Administered 2022-06-18: 325 mg via ORAL
  Filled 2022-06-18: qty 1

## 2022-06-18 MED ORDER — LISINOPRIL 10 MG PO TABS
5.0000 mg | ORAL_TABLET | Freq: Every day | ORAL | Status: DC
  Administered 2022-06-18: 5 mg via ORAL
  Filled 2022-06-18: qty 1

## 2022-06-18 MED ORDER — SODIUM CHLORIDE 0.9% FLUSH: 3.0000 mL | Freq: Two times a day (BID) | INTRAVENOUS | Status: DC

## 2022-06-18 MED ORDER — SODIUM CHLORIDE 0.9 % WEIGHT BASED INFUSION: 3.0000 mL/kg/h | INTRAVENOUS | Status: DC

## 2022-06-18 MED ORDER — ATORVASTATIN CALCIUM 80 MG PO TABS
80.0000 mg | ORAL_TABLET | Freq: Every day | ORAL | Status: DC
  Administered 2022-06-18 – 2022-06-19 (×2): 80 mg via ORAL
  Filled 2022-06-18: qty 2
  Filled 2022-06-18 (×2): qty 1

## 2022-06-18 MED ORDER — FENTANYL CITRATE (PF) 100 MCG/2ML IJ SOLN
INTRAMUSCULAR | Status: DC | PRN
  Administered 2022-06-18 (×2): 25 ug via INTRAVENOUS

## 2022-06-18 MED ORDER — CLOPIDOGREL BISULFATE 300 MG PO TABS
300.0000 mg | ORAL_TABLET | Freq: Once | ORAL | Status: AC
  Administered 2022-06-18: 300 mg via ORAL
  Filled 2022-06-18: qty 1

## 2022-06-18 MED ORDER — IOHEXOL 350 MG/ML SOLN
INTRAVENOUS | Status: DC | PRN
  Administered 2022-06-18: 155 mL

## 2022-06-18 MED ORDER — HYDRALAZINE HCL 20 MG/ML IJ SOLN: 10.0000 mg | INTRAMUSCULAR | Status: AC | PRN

## 2022-06-18 MED ORDER — HEPARIN BOLUS VIA INFUSION
4000.0000 [IU] | Freq: Once | INTRAVENOUS | Status: AC
  Administered 2022-06-18: 4000 [IU] via INTRAVENOUS
  Filled 2022-06-18: qty 4000

## 2022-06-18 MED ORDER — NITROGLYCERIN 0.4 MG SL SUBL: 0.4000 mg | SUBLINGUAL_TABLET | SUBLINGUAL | Status: DC | PRN

## 2022-06-18 MED ORDER — SODIUM CHLORIDE 0.9 % WEIGHT BASED INFUSION: 1.0000 mL/kg/h | INTRAVENOUS | Status: DC

## 2022-06-18 MED ORDER — LIDOCAINE HCL (PF) 1 % IJ SOLN
INTRAMUSCULAR | Status: AC
  Filled 2022-06-18: qty 30

## 2022-06-18 MED ORDER — SODIUM CHLORIDE 0.9 % IV SOLN: INTRAVENOUS | Status: DC

## 2022-06-18 MED ORDER — ASPIRIN 81 MG PO TBEC
81.0000 mg | DELAYED_RELEASE_TABLET | Freq: Every day | ORAL | Status: DC
  Administered 2022-06-19: 81 mg via ORAL
  Filled 2022-06-18 (×2): qty 1

## 2022-06-18 MED ORDER — FENTANYL CITRATE (PF) 100 MCG/2ML IJ SOLN
INTRAMUSCULAR | Status: AC
  Filled 2022-06-18: qty 2

## 2022-06-18 MED ORDER — MORPHINE SULFATE (PF) 2 MG/ML IV SOLN
2.0000 mg | INTRAVENOUS | Status: DC | PRN
  Administered 2022-06-18: 2 mg via INTRAVENOUS
  Filled 2022-06-18: qty 1

## 2022-06-18 MED ORDER — HEPARIN (PORCINE) IN NACL 1000-0.9 UT/500ML-% IV SOLN
INTRAVENOUS | Status: DC | PRN
  Administered 2022-06-18 (×2): 500 mL

## 2022-06-18 MED ORDER — HEPARIN (PORCINE) 25000 UT/250ML-% IV SOLN
1400.0000 [IU]/h | INTRAVENOUS | Status: DC
Start: 2022-06-18 — End: 2022-06-18
  Administered 2022-06-18: 1400 [IU]/h via INTRAVENOUS
  Filled 2022-06-18: qty 250

## 2022-06-18 MED ORDER — SODIUM CHLORIDE 0.9 % IV SOLN
INTRAVENOUS | Status: AC | PRN
  Administered 2022-06-18: 75 mL/h via INTRAVENOUS

## 2022-06-18 MED ORDER — LABETALOL HCL 5 MG/ML IV SOLN: 10.0000 mg | INTRAVENOUS | Status: AC | PRN

## 2022-06-18 MED ORDER — SODIUM CHLORIDE 0.9 % IV SOLN: 250.0000 mL | INTRAVENOUS | Status: DC | PRN

## 2022-06-18 MED ORDER — CLOPIDOGREL BISULFATE 75 MG PO TABS
75.0000 mg | ORAL_TABLET | Freq: Every day | ORAL | Status: DC
  Administered 2022-06-19: 75 mg via ORAL
  Filled 2022-06-18 (×2): qty 1

## 2022-06-18 MED ORDER — HEPARIN (PORCINE) IN NACL 1000-0.9 UT/500ML-% IV SOLN
INTRAVENOUS | Status: AC
  Filled 2022-06-18: qty 1000

## 2022-06-18 MED ORDER — NITROGLYCERIN 1 MG/10 ML FOR IR/CATH LAB
INTRA_ARTERIAL | Status: DC | PRN
  Administered 2022-06-18: 100 ug via INTRACORONARY
  Administered 2022-06-18: 100 ug via INTRA_ARTERIAL

## 2022-06-18 MED ORDER — NITROGLYCERIN IN D5W 200-5 MCG/ML-% IV SOLN: 0.0000 ug/min | INTRAVENOUS | Status: DC

## 2022-06-18 MED ORDER — NITROGLYCERIN 1 MG/10 ML FOR IR/CATH LAB
INTRA_ARTERIAL | Status: AC
  Filled 2022-06-18: qty 10

## 2022-06-18 MED ORDER — SODIUM CHLORIDE 0.9 % IV SOLN: INTRAVENOUS | Status: AC

## 2022-06-18 MED ORDER — VERAPAMIL HCL 2.5 MG/ML IV SOLN
INTRAVENOUS | Status: DC | PRN
  Administered 2022-06-18: 10 mL via INTRA_ARTERIAL

## 2022-06-18 MED ORDER — CLOPIDOGREL BISULFATE 300 MG PO TABS: 300.0000 mg | ORAL_TABLET | Freq: Every day | ORAL | Status: DC

## 2022-06-18 MED ORDER — NICOTINE 14 MG/24HR TD PT24
14.0000 mg | MEDICATED_PATCH | Freq: Every day | TRANSDERMAL | Status: DC
  Filled 2022-06-18: qty 1

## 2022-06-18 MED ORDER — NITROGLYCERIN IN D5W 200-5 MCG/ML-% IV SOLN
0.0000 ug/min | INTRAVENOUS | Status: DC
  Administered 2022-06-18: 5 ug/min via INTRAVENOUS
  Filled 2022-06-18 (×2): qty 250

## 2022-06-18 MED ORDER — VERAPAMIL HCL 2.5 MG/ML IV SOLN
INTRAVENOUS | Status: AC
  Filled 2022-06-18: qty 2

## 2022-06-18 MED ORDER — MIDAZOLAM HCL 2 MG/2ML IJ SOLN
INTRAMUSCULAR | Status: AC
  Filled 2022-06-18: qty 2

## 2022-06-18 MED ORDER — EZETIMIBE 10 MG PO TABS
10.0000 mg | ORAL_TABLET | Freq: Every day | ORAL | Status: DC
  Administered 2022-06-18 – 2022-06-19 (×2): 10 mg via ORAL
  Filled 2022-06-18 (×3): qty 1

## 2022-06-18 SURGICAL SUPPLY — 24 items
BALLN EMERGE MR 2.0X12 (BALLOONS) ×1
BALLN ~~LOC~~ EMERGE MR 2.75X12 (BALLOONS) ×1
BALLOON EMERGE MR 2.0X12 (BALLOONS) IMPLANT
BALLOON ~~LOC~~ EMERGE MR 2.75X12 (BALLOONS) IMPLANT
CATH INFINITI 5FR JL5 (CATHETERS) IMPLANT
CATH INFINITI 5FR MULTPACK ANG (CATHETERS) IMPLANT
CATH LAUNCHER 6FR EBU 4 (CATHETERS) IMPLANT
DEVICE RAD COMP TR BAND LRG (VASCULAR PRODUCTS) IMPLANT
ELECT DEFIB PAD ADLT CADENCE (PAD) IMPLANT
GLIDESHEATH SLEND A-KIT 6F 22G (SHEATH) IMPLANT
GLIDESHEATH SLEND SS 6F .021 (SHEATH) IMPLANT
GUIDEWIRE INQWIRE 1.5J.035X260 (WIRE) IMPLANT
INQWIRE 1.5J .035X260CM (WIRE) ×1
KIT ENCORE 26 ADVANTAGE (KITS) IMPLANT
KIT HEART LEFT (KITS) ×1 IMPLANT
PACK CARDIAC CATHETERIZATION (CUSTOM PROCEDURE TRAY) ×1 IMPLANT
SHEATH PROBE COVER 6X72 (BAG) IMPLANT
STENT SYNERGY XD 2.50X24 (Permanent Stent) IMPLANT
SYNERGY XD 2.50X24 (Permanent Stent) ×1 IMPLANT
TRANSDUCER W/STOPCOCK (MISCELLANEOUS) ×1 IMPLANT
TUBING CIL FLEX 10 FLL-RA (TUBING) ×1 IMPLANT
WIRE MICRO SET SILHO 5FR 7 (SHEATH) IMPLANT
WIRE RUNTHROUGH .014X180CM (WIRE) IMPLANT
WIRE RUNTHROUGH IZANAI 014 180 (WIRE) IMPLANT

## 2022-06-18 NOTE — Progress Notes (Signed)
  Echocardiogram 2D Echocardiogram has been performed.  Jay Ware 06/18/2022, 2:54 PM

## 2022-06-18 NOTE — Progress Notes (Signed)
Progress Note  Patient Name: Jay Ware Date of Encounter: 06/18/2022  Primary Cardiologist: None  Subjective   Patient admitted with NSTEMI this morning with active chest pain. Currently on NTG drip 40 mcg per minute and received fentanyl 100 mcg. He currently has 5 /10 chest pain and appears uncomfortable. He said he has not slept in 4 days due to persistent chest pain. He went to [facility] yesterday and the troponin was within normal limits. He left AMA and came to [facility].  Inpatient Medications    Scheduled Meds:  [START ON 06/19/2022] aspirin  81 mg Oral Pre-Cath   [START ON 06/19/2022] aspirin EC  81 mg Oral Daily   aspirin  325 mg Oral Daily   atorvastatin  80 mg Oral Daily   [START ON 06/19/2022] clopidogrel  75 mg Oral Daily   ezetimibe  10 mg Oral Daily   lisinopril  5 mg Oral Daily   nicotine  14 mg Transdermal Daily   Continuous Infusions:  sodium chloride 75 mL/hr at 06/18/22 1013   [START ON 06/19/2022] sodium chloride     Followed by   Melene Muller ON 06/19/2022] sodium chloride     heparin 1,400 Units/hr (06/18/22 0731)   nitroGLYCERIN 50 mcg/min (06/18/22 1013)   PRN Meds: acetaminophen, morphine injection   Vital Signs    Vitals:   06/18/22 0815 06/18/22 0830 06/18/22 0835 06/18/22 0910  BP: 107/79 (!) 150/120  (!) 155/122  Pulse: 73 76  (!) 102  Resp: 14 12  20   Temp:   (!) 97.4 F (36.3 C)   TempSrc:   Oral   SpO2: 98% 98%  97%  Weight:      Height:       No intake or output data in the 24 hours ending 06/18/22 1034 Filed Weights   06/18/22 0725  Weight: (!) 138.3 kg    Telemetry     Personally reviewed, NSR and HR controlled  ECG    An ECG dated 06/18/2022 was personally reviewed today and demonstrated:  Normal sinus rhythm and no ST-T changes  Physical Exam   GEN: acute distress due to persistent chest pain  neck: No JVD. Cardiac: RRR, no murmur, rub, or gallop.  Respiratory: Nonlabored. Clear to auscultation  bilaterally. GI: Soft, nontender, bowel sounds present. MS: No edema; No deformity. Neuro:  Nonfocal. Psych: Alert and oriented x 3. Normal affect.  Labs    Chemistry Recent Labs  Lab 06/18/22 0459  NA 140  K 4.4  CL 106  CO2 26  GLUCOSE 97  BUN 15  CREATININE 1.47*  CALCIUM 9.8  GFRNONAA 54*  ANIONGAP 8     Hematology Recent Labs  Lab 06/18/22 0459  WBC 9.3  RBC 5.31  HGB 15.4  HCT 45.2  MCV 85.1  MCH 29.0  MCHC 34.1  RDW 13.5  PLT 241    Cardiac Enzymes Recent Labs  Lab 06/18/22 0459  TROPONINIHS 3,257*    BNP Recent Labs  Lab 06/18/22 0729  BNP 17.6     DDimerNo results for input(s): "DDIMER" in the last 168 hours.   Radiology    DG Chest 2 View  Result Date: 06/18/2022 CLINICAL DATA:  61 year old male with chest pain. EXAM: CHEST - 2 VIEW COMPARISON:  CTA chest 05/08/2010 and earlier. FINDINGS: AP and lateral views at 0541 hours. Mildly lower lung volumes compared to 2011. Mediastinal contours are stable and within normal limits. Visualized tracheal air column is within normal limits.  No pneumothorax or pulmonary edema. No pleural effusion or consolidation. Crowding of lung base markings on the lateral view. No acute osseous abnormality identified. Paucity of bowel gas in the visible abdomen. IMPRESSION: Lower lung volumes with mild atelectasis. No other acute cardiopulmonary abnormality. Electronically Signed   By: Genevie Ann M.D.   On: 06/18/2022 06:23    Cardiac Studies   Echo from 07/2021 LVEF 55 to 60% Aortic root is 3.5 cm, proximal ascending aorta is 4.65 cm  LHC from 2015 at [facility] Mild nonobstructive CAD but patient stated he had PCI in the backside of his heart 15 years ago.  Assessment & Plan    Patient is a 61 year old M known to have CAD s/p PCI in 2000, paroxysmal A-fib (last dose of Eliquis was on 12/15 a.m.), PAD s/p femoral artery stent in 2011, prior nicotine abuse with current vaping, OSA on CPAP, CKD, HTN, HLD  presented to the ER with chest pain and is currently admitted to the cardiology team for NSTEMI.  # NSTEMI # CAD s/p PCI in 2000s -Patient is s/p aspirin Plavix load already.  Continue heparin drip and high intensity statin. He is currently on NTG drip 50 mcg/min with 5 / 10 chest pain despite administering fentanyl 100 mcg and morphine 4 mg. EKG showed normal sinus rhythm and no ST-T changes. High sensitive troponin is 3200 (elevated from 24 at [facility] yesterday). He is in acute distress due to ongoing chest pain and appears uncomfortable.  Patient will benefit from [facility] today due to NSTEMI with medically refractory chest pain. I spoke with interventional cardiologist on-call today, Dr. Harrell Gave who agreed with the plan of LHC today.  Last dose of Eliquis was on 06/17/2022 in the AM.  Prior LHC at [place] in 2015 noted unsuccessful radial access due to spasm. Risks and benefits of cardiac catheterization have been discussed with the patient. These include bleeding, infection, kidney damage, stroke, heart attack, death.  The patient understands these risks and is willing to proceed. -Start IV fluids normal saline 75 cc/h due to creatinine of 1.4 (1.2 yesterday). -Obtain 2D echocardiogram  # AKI versus AKI on CKD -Hold lisinopril.  Start IV fluids normal saline at 75 cc/h  # Paroxysmal A-fib, currently NSR -Not on rate controlling agents -Last dose of Eliquis was on 12/15 a.m. -Continue heparin drip  # HTN -Hold lisinopril in setting of AKI.  Will resume after BMP is normalized.  # PAD and ascending aorta dilatation -Continue aspirin and high intensity statin  # Former tobacco abuse and current vaping -Smoking cessation counseling will be provided   I have spent a total of 33 minutes with patient reviewing chart , telemetry, EKGs, labs and examining patient as well as establishing an assessment and plan that was discussed with the patient.  > 50% of time was spent in direct patient  care.    Signed, Chalmers Guest, MD  06/18/2022, 10:34 AM

## 2022-06-18 NOTE — ED Notes (Signed)
Pain 7/10 after Fentanyl

## 2022-06-18 NOTE — ED Notes (Signed)
Cardiologist at bedside.  

## 2022-06-18 NOTE — H&P (Signed)
Cardiology Admission History and Physical   Patient ID: Jay Ware MRN: [id number]; DOB: 06/30/1961   Admission date: 06/18/2022  PCP:  Curly Rim, MD   [place] Providers Cardiologist:  None        Chief Complaint:  Chest pain  Patient Profile:   Jay Ware is a 61 y.o. 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  History of Present Illness:   Jay Ware reports that he began having some mild chest pain on Wednesday that culminated in sudden onset, severe chest pain on Thursday evening.  He was in his usual state of health when he developed sudden onset, central chest tightness/pressure, that radiated to his jaw and left arm.  The pain was unrelenting and associated with SOB and nausea.  He initially presented to [facility] health and was found to have cardiac troponins elevated to 24 -> 46 -> 124.  His VS were stable.  He became frustrated awaiting further treatment so he left and came to our ED for ongoing care.  In the [facility] ED his VS were afebrile, HR 84, BP 119/102, RR 20, satting 100% on RA.  Troponins here were markedly elevated at 3257.  Serum creatinine was 1.47.  ECG was without ischemic changes and CXR was unremarkable.  Of note the patient suffered an MI back in 2000 and underwent PCI (unclear which artery per my early chart review).  Since then he has not had significant cardiac issues, but did undergo a repeat LHC in 2015 which showed "mild CAD".  He is a former smoker with a >20 pack-year smoking history and actively vapes.  He is currently retired and lives with his wife.  He endorses good adherence to his medications.  He currently has 5/10 chest pain that improved with fentanyl.  He denies PND,  orthopnea, swelling, abdominal pain, urinary changes, focal weakness/numbness, syncope, or presyncope.  He has no further complaints.   Past Medical History:  Diagnosis Date   A-fib [facility])    CAD (coronary artery disease)    Hypertension     Past Surgical History:  Procedure Laterality Date   CARDIAC CATHETERIZATION       Medications Prior to Admission: Prior to Admission medications   Medication Sig Start Date End Date Taking? Authorizing Provider  amoxicillin-clavulanate (AUGMENTIN) 875-125 MG tablet Take 1 tablet by mouth 2 (two) times daily. One po bid x 7 days 10/17/21   Drenda Freeze, MD  lidocaine (LIDODERM) 5 % Place 1 patch onto the skin daily. Remove & Discard patch within 12 hours or as directed by MD 04/20/22   Tedd Sias, PA  methocarbamol (ROBAXIN) 500 MG tablet Take 1 tablet (500 mg total) by mouth 2 (two) times daily. 04/20/22   Tedd Sias, PA  oxyCODONE-acetaminophen (PERCOCET/ROXICET) 5-325 MG tablet Take 1 tablet by mouth every 6 (six) hours as needed for severe pain. 10/17/21   Drenda Freeze, MD     Allergies:   No Known Allergies  Social History:   Social History   Socioeconomic History   Marital status: Married    Spouse name: Not on file   Number of children: Not on file   Years of  education: Not on file   Highest education level: Not on file  Occupational History   Not on file  Tobacco Use   Smoking status: Former    Types: Cigarettes   Smokeless tobacco: Never  Vaping Use   Vaping Use: Some days   Substances: Nicotine  Substance and Sexual Activity   Alcohol use: Not Currently   Drug use: Not Currently   Sexual activity: Not on file  Other Topics Concern   Not on file  Social History Narrative   Not on file   Social Determinants of Health   Financial Resource Strain: Not on file  Food Insecurity: Not on file  Transportation Needs: Not on file  Physical Activity: Not on file  Stress: Not on file  Social  Connections: Not on file  Intimate Partner Violence: Not on file    Family History:   The patient's family history includes Hypertension in his mother.    ROS:  Please see the history of present illness.  All other ROS reviewed and negative.     Physical Exam/Data:   Vitals:   06/18/22 0442  BP: (!) 119/102  Pulse: 84  Resp: 20  Temp: 97.8 F (36.6 C)  SpO2: 100%   No intake or output data in the 24 hours ending 06/18/22 0608    04/20/2022    7:54 PM 10/17/2021    6:29 PM  Last 3 Weights  Weight (lbs) 310 lb 310 lb  Weight (kg) 140.615 kg 140.615 kg     There is no height or weight on file to calculate BMI.  General:  Well nourished, well developed, in no acute distress HEENT: normal Neck: no JVD Vascular: No carotid bruits; Distal pulses 2+ bilaterally   Cardiac:  normal S1, S2; RRR; no murmur  Lungs:  clear to auscultation bilaterally, no wheezing, rhonchi or rales  Abd: soft, nontender, no hepatomegaly  Ext: no edema Musculoskeletal:  No deformities, BUE and BLE strength normal and equal Skin: warm and dry  Neuro:  CNs 2-12 intact, no focal abnormalities noted Psych:  Normal affect    EKG:  The ECG that was done  was personally reviewed and demonstrates: NSR    Relevant CV Studies:  TTE 11/13/19 ([facility]):  Impression  Left Ventricle: There is mild concentric hypertrophy. Systolic function is mildly abnormal. EF: 40-45%. Mild global hypokinesis.   Mitral Valve: There is trace regurgitation.   LHC 10/23/13 ([facility]):  FINDINGS:   Coronary Angiography  1. Left Main - Normal  2. Left anterior descending artery - 10% mid  3. Diagonals - 10% proximal diagonal one  4. Left Circumflex - Normal  5. Obtuse Marginals - Normal  6. Right Coronary Artery - 25% mid  7. Posterior Descending Artery - Normal    Laboratory Data:  High Sensitivity Troponin:   Recent Labs  Lab 06/18/22 0459  TROPONINIHS 3,257*      Chemistry Recent Labs  Lab  06/18/22 0459  NA 140  K 4.4  CL 106  CO2 26  GLUCOSE 97  BUN 15  CREATININE 1.47*  CALCIUM 9.8  GFRNONAA 54*  ANIONGAP 8    No results for input(s): "PROT", "ALBUMIN", "AST", "ALT", "ALKPHOS", "BILITOT" in the last 168 hours. Lipids No results for input(s): "CHOL", "TRIG", "HDL", "LABVLDL", "LDLCALC", "CHOLHDL" in the last 168 hours. Hematology Recent Labs  Lab 06/18/22 0459  WBC 9.3  RBC 5.31  HGB 15.4  HCT 45.2  MCV 85.1  MCH 29.0  MCHC 34.1  RDW 13.5  PLT 241   Thyroid No results for input(s): "TSH", "FREET4" in the last 168 hours. BNPNo results for input(s): "BNP", "PROBNP" in the last 168 hours.  DDimer No results for input(s): "DDIMER" in the last 168 hours.   Radiology/Studies:  No results found.   Assessment and Plan:   Jay Ware is a 61 y.o. 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  #Type 1 NSTEMI #Known CAD s/p PCI #HLD #HTN :: Patient presenting with symptoms consistent with ACS and markedly elevated troponins.  His presentation is consistent with a type I NSTEMI.  Chest pain currently ongoing but responding to treatment.  He will ultimately need an LHC with likely PCI pending evaluation.  His risk factors include morbid obesity, active vaping, and sedentary lifestyle.  He will certainly need to work on secondary prevention moving forward. -Start heparin GTT -s/p ASA load -Continue aspirin 81 mg daily -Load Plavix 300 mg x1 followed by Plavix 75 mg daily -Continue home atorvastatin 80 -Continue home lisinopril 5 -Hold home Bystolic pending TTE EF assessment -Lipid panel, A1c, TSH -Left heart cath tentatively on Monday -Start nitro gtt titrated to chest pain-free -Maintain telemetry -Cardiac  rehab at discharge  #PAF :: Last dose of Eliquis on 12/15 PM.  Will hold Eliquis and lieu of heparin pending LHC.  Loading with Plavix given that he will need long-term OAC per Aristotle trial. 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  Appears euvolemic on assessment today.  Will get TTE to reassess EF in light of ACS. -TTE  #OSA -continue CPAP   Risk Assessment/Risk Scores:    TIMI Risk Score for Unstable Angina or Non-ST Elevation MI:   The patient's TIMI risk score is  , which indicates a  % risk of all cause mortality, new or recurrent myocardial infarction or need for urgent revascularization in the next 14 days.    CHA2DS2-VASc Score =   3  This indicates a 3.2 % annual risk of stroke. The patient's score is based upon:   CHF, HTN, vascular disease     Severity of Illness: The appropriate patient status for this patient is INPATIENT. Inpatient status is judged to be reasonable and necessary in order to provide the required intensity of service to ensure the patient's safety. The patient's presenting symptoms, physical exam findings, and initial radiographic and laboratory data in the context of their chronic comorbidities is felt to place them at high risk for further clinical deterioration. Furthermore, it is not anticipated that the patient will be medically stable for discharge from the hospital within 2 midnights of admission.   * I certify that at the point of admission it is my clinical judgment that the patient will require inpatient hospital care spanning beyond 2 midnights from the point of admission due to high intensity of service, high risk for further  deterioration and high frequency of surveillance required.*   For  questions or updates, please contact [place] Please consult [email or web address] for contact info under     Signed, Hershal Coria, MD  06/18/2022 6:08 AM

## 2022-06-18 NOTE — Progress Notes (Signed)
   06/18/22 1020  Clinical Encounter Type  Visited With Patient not available  Visit Type Initial (STEMI)  Referral From Nurse  Consult/Referral To Chaplain   Chaplain responded to a STEMI. Patient was under the care of the medical team. No family present.  If a chaplain is requested someone will respond.   Valerie Roys [facility]  [phone]

## 2022-06-18 NOTE — ED Provider Notes (Signed)
[facility] EMERGENCY DEPARTMENT Provider Note   CSN: [id number] Arrival date & time: 06/18/22  0436     History  Chief Complaint  Patient presents with   Chest Pain    KORRIE STALCUP is a 61 y.o. male.  61 year old male with history coronary artery disease status post stent multiple years ago from "a [place] cardiologist on [employer]" presents to the ER today after leaving [place] from [place] with continued chest pain.  Patient states he was at rest and had severe left-sided chest pain radiating to his jaw, neck and shoulder similar to previous MI.  On review of the records there EKG is reported as normal although I cannot view it myself.  His troponin was initially 24 and his second was 124.  Sounds like he was given an adequate pain control and got upset about it and that is why he left.  He has continued pain.  He is already had aspirin. No recent trauma, cough or fever. No LE edema.    Chest Pain      Home Medications Prior to Admission medications   Medication Sig Start Date End Date Taking? Authorizing Provider  amoxicillin-clavulanate (AUGMENTIN) 875-125 MG tablet Take 1 tablet by mouth 2 (two) times daily. One po bid x 7 days 10/17/21  Yes Drenda Freeze, MD  lidocaine (LIDODERM) 5 % Place 1 patch onto the skin daily. Remove & Discard patch within 12 hours or as directed by MD 04/20/22  Yes Fondaw, Kathleene Hazel, PA  methocarbamol (ROBAXIN) 500 MG tablet Take 1 tablet (500 mg total) by mouth 2 (two) times daily. 04/20/22  Yes Fondaw, Ova Freshwater S, PA  oxyCODONE-acetaminophen (PERCOCET/ROXICET) 5-325 MG tablet Take 1 tablet by mouth every 6 (six) hours as needed for severe pain. 10/17/21  Yes Drenda Freeze, MD      Allergies    Patient has no known allergies.    Review of Systems   Review of Systems  Cardiovascular:  Positive for chest pain.    Physical Exam Updated Vital Signs BP 121/88   Pulse 76   Temp 97.8 F (36.6 C)    Resp 18   SpO2 98%  Physical Exam Vitals and nursing note reviewed.  Constitutional:      Appearance: He is well-developed.  HENT:     Head: Normocephalic and atraumatic.  Cardiovascular:     Rate and Rhythm: Normal rate.  Pulmonary:     Effort: Pulmonary effort is normal. No respiratory distress.  Abdominal:     General: There is no distension.  Musculoskeletal:        General: Normal range of motion.     Cervical back: Normal range of motion.  Skin:    General: Skin is warm and dry.  Neurological:     Mental Status: He is alert.     ED Results / Procedures / Treatments   Labs (all labs ordered are listed, but only abnormal results are displayed) Labs Reviewed  BASIC METABOLIC PANEL - Abnormal; Notable for the following components:      Result Value   Creatinine, Ser 1.47 (*)    GFR, Estimated 54 (*)    All other components within normal limits  TROPONIN I (HIGH SENSITIVITY) - Abnormal; Notable for the following components:   Troponin I (High Sensitivity) 3,257 (*)    All other components within normal limits  CBC  HIV ANTIBODY (ROUTINE TESTING W REFLEX)  HEPARIN LEVEL (UNFRACTIONATED)  APTT  LIPID  PANEL  HEMOGLOBIN A1C  TSH  TROPONIN I (HIGH SENSITIVITY)    EKG EKG Interpretation  Date/Time:  Saturday June 18 2022 04:30:28 EST Ventricular Rate:  80 PR Interval:  164 QRS Duration: 108 QT Interval:  404 QTC Calculation: 465 R Axis:   -43 Text Interpretation: Normal sinus rhythm Left axis deviation Low voltage QRS Cannot rule out Inferior infarct , age undetermined Cannot rule out Anterior infarct , age undetermined Abnormal ECG When compared with ECG of 10-May-2010 05:31, PREVIOUS ECG IS PRESENT Confirmed by Marily Memos [phone]) on 06/18/2022 6:00:29 AM  Radiology DG Chest 2 View  Result Date: 06/18/2022 CLINICAL DATA:  61 year old male with chest pain. EXAM: CHEST - 2 VIEW COMPARISON:  CTA chest 05/08/2010 and earlier. FINDINGS: AP and lateral views  at 0541 hours. Mildly lower lung volumes compared to 2011. Mediastinal contours are stable and within normal limits. Visualized tracheal air column is within normal limits. No pneumothorax or pulmonary edema. No pleural effusion or consolidation. Crowding of lung base markings on the lateral view. No acute osseous abnormality identified. Paucity of bowel gas in the visible abdomen. IMPRESSION: Lower lung volumes with mild atelectasis. No other acute cardiopulmonary abnormality. Electronically Signed   By: Odessa Fleming M.D.   On: 06/18/2022 06:23    Procedures .Critical Care  Performed by: Marily Memos, MD Authorized by: Marily Memos, MD   Critical care provider statement:    Critical care time (minutes):  30   Critical care was necessary to treat or prevent imminent or life-threatening deterioration of the following conditions:  Cardiac failure   Critical care was time spent personally by me on the following activities:  Development of treatment plan with patient or surrogate, discussions with consultants, evaluation of patient's response to treatment, examination of patient, ordering and review of laboratory studies, ordering and review of radiographic studies, ordering and performing treatments and interventions, pulse oximetry, re-evaluation of patient's condition and review of old charts     Medications Ordered in ED Medications  nitroGLYCERIN 50 mg in dextrose 5 % 250 mL (0.2 mg/mL) infusion (5 mcg/min Intravenous New Bag/Given 06/18/22 0619)  heparin ADULT infusion 100 units/mL (25000 units/21mL) (1,400 Units/hr Intravenous New Bag/Given 06/18/22 0626)  aspirin EC tablet 81 mg (has no administration in time range)  nitroGLYCERIN (NITROSTAT) SL tablet 0.4 mg (has no administration in time range)  acetaminophen (TYLENOL) tablet 650 mg (has no administration in time range)  fentaNYL (SUBLIMAZE) injection 100 mcg (100 mcg Intravenous Given 06/18/22 0608)  heparin bolus via infusion 4,000 Units  (4,000 Units Intravenous Bolus from Bag 06/18/22 0626)    ED Course/ Medical Decision Making/ A&P                           Medical Decision Making Amount and/or Complexity of Data Reviewed Labs: ordered.  Risk Prescription drug management. Decision regarding hospitalization.   61 year old male with likely NSTEMI.  Normal EKG very concerning story and troponins that rose from 24-124 at outside facility.  Will start heparin and nitro and give fentanyl for pain control.  Discussed with cardiology.  First troponin came back over 3000.  Cardiology spoken with and they will see him plan to work on nitro for chest pain control prior to activate Cath Lab.  Cardiology has seen and will admit.  Chest x-ray reviewed by myself without any obvious fluid overload, pneumonia, pneumothorax or other concerning findings.  Final Clinical Impression(s) / ED Diagnoses Final diagnoses:  Non-Q wave non-ST elevation myocardial infarction (NSTEMI) [facility])    Rx / DC Orders ED Discharge Orders     None         Chakira Jachim, Corene Cornea, MD 06/18/22 [id number]

## 2022-06-18 NOTE — H&P (View-Only) (Signed)
Progress Note  Patient Name: Jay Ware Date of Encounter: 06/18/2022  Primary Cardiologist: None  Subjective   Patient admitted with NSTEMI this morning with active chest pain. Currently on NTG drip 40 mcg per minute and received fentanyl 100 mcg. He currently has 5 /10 chest pain and appears uncomfortable. He said he has not slept in 4 days due to persistent chest pain. He went to [facility] yesterday and the troponin was within normal limits. He left AMA and came to [facility].  Inpatient Medications    Scheduled Meds:  [START ON 06/19/2022] aspirin  81 mg Oral Pre-Cath   [START ON 06/19/2022] aspirin EC  81 mg Oral Daily   aspirin  325 mg Oral Daily   atorvastatin  80 mg Oral Daily   [START ON 06/19/2022] clopidogrel  75 mg Oral Daily   ezetimibe  10 mg Oral Daily   lisinopril  5 mg Oral Daily   nicotine  14 mg Transdermal Daily   Continuous Infusions:  sodium chloride 75 mL/hr at 06/18/22 1013   [START ON 06/19/2022] sodium chloride     Followed by   Melene Muller ON 06/19/2022] sodium chloride     heparin 1,400 Units/hr (06/18/22 0731)   nitroGLYCERIN 50 mcg/min (06/18/22 1013)   PRN Meds: acetaminophen, morphine injection   Vital Signs    Vitals:   06/18/22 0815 06/18/22 0830 06/18/22 0835 06/18/22 0910  BP: 107/79 (!) 150/120  (!) 155/122  Pulse: 73 76  (!) 102  Resp: 14 12  20   Temp:   (!) 97.4 F (36.3 C)   TempSrc:   Oral   SpO2: 98% 98%  97%  Weight:      Height:       No intake or output data in the 24 hours ending 06/18/22 1034 Filed Weights   06/18/22 0725  Weight: (!) 138.3 kg    Telemetry     Personally reviewed, NSR and HR controlled  ECG    An ECG dated 06/18/2022 was personally reviewed today and demonstrated:  Normal sinus rhythm and no ST-T changes  Physical Exam   GEN: acute distress due to persistent chest pain  neck: No JVD. Cardiac: RRR, no murmur, rub, or gallop.  Respiratory: Nonlabored. Clear to auscultation  bilaterally. GI: Soft, nontender, bowel sounds present. MS: No edema; No deformity. Neuro:  Nonfocal. Psych: Alert and oriented x 3. Normal affect.  Labs    Chemistry Recent Labs  Lab 06/18/22 0459  NA 140  K 4.4  CL 106  CO2 26  GLUCOSE 97  BUN 15  CREATININE 1.47*  CALCIUM 9.8  GFRNONAA 54*  ANIONGAP 8     Hematology Recent Labs  Lab 06/18/22 0459  WBC 9.3  RBC 5.31  HGB 15.4  HCT 45.2  MCV 85.1  MCH 29.0  MCHC 34.1  RDW 13.5  PLT 241    Cardiac Enzymes Recent Labs  Lab 06/18/22 0459  TROPONINIHS 3,257*    BNP Recent Labs  Lab 06/18/22 0729  BNP 17.6     DDimerNo results for input(s): "DDIMER" in the last 168 hours.   Radiology    DG Chest 2 View  Result Date: 06/18/2022 CLINICAL DATA:  61 year old male with chest pain. EXAM: CHEST - 2 VIEW COMPARISON:  CTA chest 05/08/2010 and earlier. FINDINGS: AP and lateral views at 0541 hours. Mildly lower lung volumes compared to 2011. Mediastinal contours are stable and within normal limits. Visualized tracheal air column is within normal limits.  No pneumothorax or pulmonary edema. No pleural effusion or consolidation. Crowding of lung base markings on the lateral view. No acute osseous abnormality identified. Paucity of bowel gas in the visible abdomen. IMPRESSION: Lower lung volumes with mild atelectasis. No other acute cardiopulmonary abnormality. Electronically Signed   By: Genevie Ann M.D.   On: 06/18/2022 06:23    Cardiac Studies   Echo from 07/2021 LVEF 55 to 60% Aortic root is 3.5 cm, proximal ascending aorta is 4.65 cm  LHC from 2015 at [facility] Mild nonobstructive CAD but patient stated he had PCI in the backside of his heart 15 years ago.  Assessment & Plan    Patient is a 61 year old M known to have CAD s/p PCI in 2000, paroxysmal A-fib (last dose of Eliquis was on 12/15 a.m.), PAD s/p femoral artery stent in 2011, prior nicotine abuse with current vaping, OSA on CPAP, CKD, HTN, HLD  presented to the ER with chest pain and is currently admitted to the cardiology team for NSTEMI.  # NSTEMI # CAD s/p PCI in 2000s -Patient is s/p aspirin Plavix load already.  Continue heparin drip and high intensity statin. He is currently on NTG drip 50 mcg/min with 5 / 10 chest pain despite administering fentanyl 100 mcg and morphine 4 mg. EKG showed normal sinus rhythm and no ST-T changes. High sensitive troponin is 3200 (elevated from 24 at [facility] yesterday). He is in acute distress due to ongoing chest pain and appears uncomfortable.  Patient will benefit from [facility] today due to NSTEMI with medically refractory chest pain. I spoke with interventional cardiologist on-call today, Dr. Harrell Gave who agreed with the plan of LHC today.  Last dose of Eliquis was on 06/17/2022 in the AM.  Prior LHC at [place] in 2015 noted unsuccessful radial access due to spasm. Risks and benefits of cardiac catheterization have been discussed with the patient. These include bleeding, infection, kidney damage, stroke, heart attack, death.  The patient understands these risks and is willing to proceed. -Start IV fluids normal saline 75 cc/h due to creatinine of 1.4 (1.2 yesterday). -Obtain 2D echocardiogram  # AKI versus AKI on CKD -Hold lisinopril.  Start IV fluids normal saline at 75 cc/h  # Paroxysmal A-fib, currently NSR -Not on rate controlling agents -Last dose of Eliquis was on 12/15 a.m. -Continue heparin drip  # HTN -Hold lisinopril in setting of AKI.  Will resume after BMP is normalized.  # PAD and ascending aorta dilatation -Continue aspirin and high intensity statin  # Former tobacco abuse and current vaping -Smoking cessation counseling will be provided   I have spent a total of 33 minutes with patient reviewing chart , telemetry, EKGs, labs and examining patient as well as establishing an assessment and plan that was discussed with the patient.  > 50% of time was spent in direct patient  care.    Signed, Chalmers Guest, MD  06/18/2022, 10:34 AM

## 2022-06-18 NOTE — ED Provider Triage Note (Signed)
Emergency Medicine Provider Triage Evaluation Note  Jay Ware , a 61 y.o. male  was evaluated in triage.  Pt complains of chest pain constant since 4 PM yesterday and was seen at outside hospital and had elevated troponins 46 and then elevated to 124.  Patient states he was unaware of his lab results when he left the hospital.  He has had consistent pain since that time.  He has a history of CAD with stent  Review of Systems  Positive: Chest pain Negative: Fever  Physical Exam  BP (!) 119/102   Pulse 84   Temp 97.8 F (36.6 C)   Resp 20   SpO2 100%  Gen:   Awake, no distress   Resp:  Normal effort  MSK:   Moves extremities without difficulty  Other:   Medical Decision Making  Medically screening exam initiated at 5:03 AM.  Appropriate orders placed.  TYRON MANETTA was informed that the remainder of the evaluation will be completed by another provider, this initial triage assessment does not replace that evaluation, and the importance of remaining in the ED until their evaluation is complete.  Needs to be immediately moved to bed in major care.  Has already taken 8 chewable aspirin--I did not specify when he took these.  He is on Eliquis for A-fib.  Has not had any nitroglycerin today   Gailen Shelter, [place] 06/18/22 0507

## 2022-06-18 NOTE — ED Triage Notes (Addendum)
Patient reports worsening left chest pain radiating to left arm and left lateral neck with mild SOB onset 2 days ago , no emesis or diaphoresis . History of MI/Coronary stent.

## 2022-06-18 NOTE — Progress Notes (Signed)
Pt is noncompliant with wrist precautions. Pt removed wrist splint and is moving wrist as well as pushing on TR band window. Pt has been educated by Charity fundraiser and education reinforced multiple times.

## 2022-06-18 NOTE — Interval H&P Note (Signed)
History and Physical Interval Note:  06/18/2022 10:59 AM  Jay Ware  has presented today for surgery, with the diagnosis of NSTEMI.  The various methods of treatment have been discussed with the patient and family. After consideration of risks, benefits and other options for treatment, the patient has consented to  Procedure(s): LEFT HEART CATH AND CORONARY ANGIOGRAPHY (N/A) as a surgical intervention.  The patient's history has been reviewed, patient examined, no change in status, stable for surgery.  I have reviewed the patient's chart and labs.  Questions were answered to the patient's satisfaction.    Cath Lab Visit (complete for each Cath Lab visit)  Clinical Evaluation Leading to the Procedure:   ACS: Yes.    Non-ACS:  N/A  [name]

## 2022-06-18 NOTE — Progress Notes (Addendum)
ANTICOAGULATION CONSULT NOTE - Initial Consult  Pharmacy Consult for Heparin  Indication: chest pain/ACS, afib  No Known Allergies  Vital Signs: Temp: 97.8 F (36.6 C) (12/16 0442) BP: 121/88 (12/16 0615) Pulse Rate: 76 (12/16 0615)  Labs: Recent Labs    06/18/22 0459  HGB 15.4  HCT 45.2  PLT 241  CREATININE 1.47*  TROPONINIHS 3,257*    CrCl cannot be calculated (Unknown ideal weight.).   Medical History: Past Medical History:  Diagnosis Date   A-fib [facility])    CAD (coronary artery disease)    Hypertension      Assessment: 61 y/o M with radiating chest pain and elevated troponin, on apixaban PTA for afib, it has been >12 hours since last dose of apixaban, will start heparin now, anticipate using aPTT to dose for now, labs above reviewed. Was started briefly on heparin about 5 hours ago at [facility] this AM but left AMA.   Goal of Therapy:  Heparin level 0.3-0.7 units/ml aPTT 66-102 seconds Monitor platelets by anticoagulation protocol: Yes   Plan:  Heparin 4000 units BOLUS Start heparin drip at 1400 units/hr 1300 Heparin level/aPTT Daily CBC/Heparin level/aPTT Monitor for bleeding  Abran Duke, PharmD, BCPS Clinical Pharmacist Phone: [phone]

## 2022-06-19 ENCOUNTER — Other Ambulatory Visit: Payer: Self-pay | Admitting: Physician Assistant

## 2022-06-19 DIAGNOSIS — N182 Chronic kidney disease, stage 2 (mild): Secondary | ICD-10-CM | POA: Insufficient documentation

## 2022-06-19 DIAGNOSIS — I10 Essential (primary) hypertension: Secondary | ICD-10-CM | POA: Diagnosis not present

## 2022-06-19 DIAGNOSIS — I2510 Atherosclerotic heart disease of native coronary artery without angina pectoris: Secondary | ICD-10-CM | POA: Insufficient documentation

## 2022-06-19 DIAGNOSIS — E785 Hyperlipidemia, unspecified: Secondary | ICD-10-CM | POA: Insufficient documentation

## 2022-06-19 DIAGNOSIS — N179 Acute kidney failure, unspecified: Secondary | ICD-10-CM

## 2022-06-19 DIAGNOSIS — I480 Paroxysmal atrial fibrillation: Secondary | ICD-10-CM

## 2022-06-19 DIAGNOSIS — I7121 Aneurysm of the ascending aorta, without rupture: Secondary | ICD-10-CM | POA: Insufficient documentation

## 2022-06-19 DIAGNOSIS — I214 Non-ST elevation (NSTEMI) myocardial infarction: Secondary | ICD-10-CM | POA: Diagnosis not present

## 2022-06-19 DIAGNOSIS — G4733 Obstructive sleep apnea (adult) (pediatric): Secondary | ICD-10-CM | POA: Insufficient documentation

## 2022-06-19 DIAGNOSIS — I739 Peripheral vascular disease, unspecified: Secondary | ICD-10-CM | POA: Diagnosis not present

## 2022-06-19 LAB — CBC
HCT: 41.6 % (ref 39.0–52.0)
Hemoglobin: 13.8 g/dL (ref 13.0–17.0)
MCH: 28.3 pg (ref 26.0–34.0)
MCHC: 33.2 g/dL (ref 30.0–36.0)
MCV: 85.4 fL (ref 80.0–100.0)
Platelets: 201 10*3/uL (ref 150–400)
RBC: 4.87 MIL/uL (ref 4.22–5.81)
RDW: 13.8 % (ref 11.5–15.5)
WBC: 8.2 10*3/uL (ref 4.0–10.5)
nRBC: 0 % (ref 0.0–0.2)

## 2022-06-19 LAB — BASIC METABOLIC PANEL
Anion gap: 6 (ref 5–15)
BUN: 15 mg/dL (ref 8–23)
CO2: 25 mmol/L (ref 22–32)
Calcium: 8.6 mg/dL — ABNORMAL LOW (ref 8.9–10.3)
Chloride: 107 mmol/L (ref 98–111)
Creatinine, Ser: 1.36 mg/dL — ABNORMAL HIGH (ref 0.61–1.24)
GFR, Estimated: 59 mL/min — ABNORMAL LOW (ref >60)
Glucose, Bld: 94 mg/dL (ref 70–99)
Potassium: 4 mmol/L (ref 3.5–5.1)
Sodium: 138 mmol/L (ref 135–145)

## 2022-06-19 MED ORDER — ACETAMINOPHEN 325 MG PO TABS: 650.0000 mg | ORAL_TABLET | Freq: Four times a day (QID) | ORAL | Status: DC | PRN

## 2022-06-19 MED ORDER — APIXABAN 5 MG PO TABS
5.0000 mg | ORAL_TABLET | Freq: Two times a day (BID) | ORAL | Status: DC
  Administered 2022-06-19: 5 mg via ORAL
  Filled 2022-06-19: qty 1

## 2022-06-19 MED ORDER — SODIUM CHLORIDE 0.9 % IV SOLN: INTRAVENOUS | Status: DC

## 2022-06-19 MED ORDER — ASPIRIN 81 MG PO TBEC: DELAYED_RELEASE_TABLET | ORAL | 12 refills | Status: AC

## 2022-06-19 MED ORDER — CLOPIDOGREL BISULFATE 75 MG PO TABS: 75.0000 mg | ORAL_TABLET | Freq: Every day | ORAL | 2 refills | Status: DC

## 2022-06-19 MED ORDER — NICOTINE 14 MG/24HR TD PT24: 14.0000 mg | MEDICATED_PATCH | Freq: Every day | TRANSDERMAL | 0 refills | Status: AC

## 2022-06-19 MED ORDER — METOPROLOL SUCCINATE ER 25 MG PO TB24
25.0000 mg | ORAL_TABLET | Freq: Every day | ORAL | Status: DC
  Administered 2022-06-19: 25 mg via ORAL
  Filled 2022-06-19 (×2): qty 1

## 2022-06-19 MED ORDER — METOPROLOL SUCCINATE ER 25 MG PO TB24: 25.0000 mg | ORAL_TABLET | Freq: Every day | ORAL | 1 refills | Status: DC

## 2022-06-19 NOTE — Progress Notes (Signed)
Progress Note  Patient Name: Jay Ware Date of Encounter: 06/19/2022  Primary Cardiologist: None  Subjective   No acute events overnight.  Left radial access site intact with no swelling/hematoma.  No numbness/weakness in his left hand.  Inpatient Medications    Scheduled Meds:  aspirin EC  81 mg Oral Daily   atorvastatin  80 mg Oral Daily   clopidogrel  75 mg Oral Daily   ezetimibe  10 mg Oral Daily   metoprolol succinate  25 mg Oral Daily   nicotine  14 mg Transdermal Daily   sodium chloride flush  3 mL Intravenous Q12H   Continuous Infusions:  sodium chloride     sodium chloride     PRN Meds: sodium chloride, acetaminophen, morphine injection, ondansetron (ZOFRAN) IV, sodium chloride flush   Vital Signs    Vitals:   06/18/22 2217 06/18/22 2320 06/19/22 0407 06/19/22 0816  BP: 105/75 114/69 (!) 94/54 110/69  Pulse: 76   99  Resp: 19   15  Temp:  98.5 F (36.9 C) 98.7 F (37.1 C) 98.3 F (36.8 C)  TempSrc:  Oral Oral Oral  SpO2: 94%   96%  Weight:   (!) 140.1 kg   Height:        Intake/Output Summary (Last 24 hours) at 06/19/2022 0933 Last data filed at 06/19/2022 [id number] Gross per 24 hour  Intake 165.8 ml  Output 0 ml  Net 165.8 ml   Filed Weights   06/18/22 0725 06/19/22 0407  Weight: (!) 138.3 kg (!) 140.1 kg    Telemetry     Personally reviewed, 7 beat of nonsustained V. tach last night  ECG    Normal rhythm and no ST changes  Physical Exam   GEN: No acute distress.   Neck: No JVD. Cardiac: RRR, no murmur, rub, or gallop.  Respiratory: Nonlabored. Clear to auscultation bilaterally. GI: Soft, nontender, bowel sounds present. MS: No edema; No deformity. Neuro:  Nonfocal. Psych: Alert and oriented x 3. Normal affect.  Labs    Chemistry Recent Labs  Lab 06/18/22 0459 06/19/22 0017  NA 140 138  K 4.4 4.0  CL 106 107  CO2 26 25  GLUCOSE 97 94  BUN 15 15  CREATININE 1.47* 1.36*  CALCIUM 9.8 8.6*  GFRNONAA 54* 59*   ANIONGAP 8 6     Hematology Recent Labs  Lab 06/18/22 0459 06/19/22 0017  WBC 9.3 8.2  RBC 5.31 4.87  HGB 15.4 13.8  HCT 45.2 41.6  MCV 85.1 85.4  MCH 29.0 28.3  MCHC 34.1 33.2  RDW 13.5 13.8  PLT 241 201    Cardiac Enzymes Recent Labs  Lab 06/18/22 0459 06/18/22 0729  TROPONINIHS 3,257* 7,811*    BNP Recent Labs  Lab 06/18/22 0729  BNP 17.6     DDimerNo results for input(s): "DDIMER" in the last 168 hours.   Cardiac studies    ECHOCARDIOGRAM COMPLETE  Result Date: 06/18/2022    ECHOCARDIOGRAM REPORT   Patient Name:   Jay Ware [facility] Date of Exam: 06/18/2022 Medical Rec #:  [id number]       Height:       73.0 in Accession #:    [id number]      Weight:       305.0 lb Date of Birth:  1961-05-08      BSA:          2.574 m Patient Age:    61 years  BP:           98/79 mmHg Patient Gender: M               HR:           72 bpm. Exam Location:  Inpatient Procedure: 2D Echo, Cardiac Doppler, Color Doppler and Intracardiac            Opacification Agent Indications:    122-I22.9 Subsequent ST elevation (STEM) and non-ST elevation                 (NSTEMI) myocardial infarction  History:        Patient has prior history of Echocardiogram examinations, most                 recent 05/09/2010. CAD and Acute MI; Signs/Symptoms:Chest Pain.  Sonographer:    Roseanna Rainbow RDCS Referring Phys: [phone] CHRISTOPHER END  Sonographer Comments: Technically difficult study due to poor echo windows and patient is obese. Image acquisition challenging due to patient body habitus and Image acquisition challenging due to respiratory motion. Patient moving throughout study. Could  not turn due to pressure band on left. Subotimal study even with Definity. Patient is post PCI. IMPRESSIONS  1. Left ventricular ejection fraction, by estimation, is 60 to 65%. The left ventricle has normal function. The left ventricle has no regional wall motion abnormalities. Left ventricular diastolic parameters are consistent  with Grade I diastolic dysfunction (impaired relaxation).  2. Right ventricular systolic function is mildly reduced. The right ventricular size is mildly enlarged.  3. The mitral valve is grossly normal. No evidence of mitral valve regurgitation. No evidence of mitral stenosis.  4. The aortic valve is grossly normal. Aortic valve regurgitation is not visualized. No aortic stenosis is present.  5. Aortic dilatation noted. There is moderate dilatation of the aortic root, measuring 48 mm. There is mild dilatation of the ascending aorta, measuring 46 mm.  6. The inferior vena cava is dilated in size with >50% respiratory variability, suggesting right atrial pressure of 8 mmHg. Comparison(s): No prior Echocardiogram. FINDINGS  Left Ventricle: Left ventricular ejection fraction, by estimation, is 60 to 65%. The left ventricle has normal function. The left ventricle has no regional wall motion abnormalities. Definity contrast agent was given IV to delineate the left ventricular  endocardial borders. The left ventricular internal cavity size was normal in size. There is borderline left ventricular hypertrophy. Left ventricular diastolic parameters are consistent with Grade I diastolic dysfunction (impaired relaxation). Right Ventricle: The right ventricular size is mildly enlarged. Right vetricular wall thickness was not well visualized. Right ventricular systolic function is mildly reduced. Left Atrium: Left atrial size was normal in size. Right Atrium: Right atrial size was normal in size. Pericardium: Trivial pericardial effusion is present. Mitral Valve: The mitral valve is grossly normal. No evidence of mitral valve regurgitation. No evidence of mitral valve stenosis. Tricuspid Valve: The tricuspid valve is not well visualized. Tricuspid valve regurgitation is not demonstrated. No evidence of tricuspid stenosis. Aortic Valve: The aortic valve is grossly normal. Aortic valve regurgitation is not visualized. No aortic  stenosis is present. Pulmonic Valve: The pulmonic valve was not well visualized. Pulmonic valve regurgitation is trivial. No evidence of pulmonic stenosis. Aorta: Aortic dilatation noted. There is moderate dilatation of the aortic root, measuring 48 mm. There is mild dilatation of the ascending aorta, measuring 46 mm. Venous: The inferior vena cava is dilated in size with greater than 50% respiratory variability, suggesting right atrial pressure  of 8 mmHg. IAS/Shunts: The interatrial septum was not assessed.  LEFT VENTRICLE PLAX 2D LVIDd:         4.55 cm      Diastology LVIDs:         3.45 cm      LV e' medial:    7.29 cm/s LV PW:         1.40 cm      LV E/e' medial:  8.5 LV IVS:        1.40 cm      LV e' lateral:   9.36 cm/s LVOT diam:     2.60 cm      LV E/e' lateral: 6.6 LV SV:         95 LV SV Index:   37 LVOT Area:     5.31 cm  LV Volumes (MOD) LV vol d, MOD A2C: 81.0 ml LV vol d, MOD A4C: 136.0 ml LV vol s, MOD A2C: 32.1 ml LV vol s, MOD A4C: 74.8 ml LV SV MOD A2C:     48.9 ml LV SV MOD A4C:     136.0 ml LV SV MOD BP:      51.5 ml RIGHT VENTRICLE             IVC RV S prime:     13.40 cm/s  IVC diam: 2.10 cm TAPSE (M-mode): 1.8 cm LEFT ATRIUM             Index        RIGHT ATRIUM           Index LA diam:        3.70 cm 1.44 cm/m   RA Area:     13.90 cm LA Vol (A2C):   29.3 ml 11.38 ml/m  RA Volume:   28.00 ml  10.88 ml/m LA Vol (A4C):   23.1 ml 8.97 ml/m LA Biplane Vol: 26.3 ml 10.22 ml/m  AORTIC VALVE LVOT Vmax:   99.00 cm/s LVOT Vmean:  60.200 cm/s LVOT VTI:    0.179 m  AORTA Ao Root diam: 4.80 cm Ao Asc diam:  4.40 cm MITRAL VALVE MV Area (PHT): 3.39 cm    SHUNTS MV Decel Time: 224 msec    Systemic VTI:  0.18 m MV E velocity: 61.70 cm/s  Systemic Diam: 2.60 cm MV A velocity: 75.00 cm/s MV E/A ratio:  0.82 Kingslee Dowse Priya Cordarro Spinnato Electronically signed by Winfield Rast Orvetta Danielski Signature Date/Time: 06/18/2022/4:29:44 PM    Final    CARDIAC CATHETERIZATION  Result Date: 06/18/2022 Conclusions:  Severe single-vessel coronary artery disease with thrombotic occlusion of moderate-caliber OM1 branch.  Otherwise, there is mild, nonobstructive coronary artery disease involving the LAD and RCA. Widely patent RPDA stent. Upper normal left ventricular filling pressure (LVEDP 15 mmHg). Severe tortuosity of distal left radial artery, successfully navigated with considerable difficulty.  Consider alternative approach for future catheterizations. Successful PCI to OM1 using Synergy 2.5 x 24 mm drug-eluting stent (postdilated to 2.8 mm) with 0% residual stenosis and TIMI-3 flow. Recommendations: Aggressive secondary prevention of coronary artery disease. Continue dual antiplatelet therapy with aspirin and clopidogrel.  If no evidence of bleeding/vascular injury, apixaban can be restarted as soon as tomorrow.  I recommend aspirin + clopidogrel + apixaban for 1 week, after which time aspirin can be discontinued and clopidogrel + apixaban continued for at least 12 months. Gentle post catheterization hydration. Follow-up echocardiogram. Yvonne Kendall, MD [facility]  DG Chest 2 View  Result Date: 06/18/2022 CLINICAL DATA:  61 year old male with chest pain. EXAM: CHEST - 2 VIEW COMPARISON:  CTA chest 05/08/2010 and earlier. FINDINGS: AP and lateral views at 0541 hours. Mildly lower lung volumes compared to 2011. Mediastinal contours are stable and within normal limits. Visualized tracheal air column is within normal limits. No pneumothorax or pulmonary edema. No pleural effusion or consolidation. Crowding of lung base markings on the lateral view. No acute osseous abnormality identified. Paucity of bowel gas in the visible abdomen. IMPRESSION: Lower lung volumes with mild atelectasis. No other acute cardiopulmonary abnormality. Electronically Signed   By: Genevie Ann M.D.   On: 06/18/2022 06:23      Assessment & Plan    Patient is a 61 year old M known to have CAD status post PCI in 2000, paroxysmal A-fib, PAD  status post femoral artery stent in 2011, prior nicotine abuse with current vaping, OSA on CPAP, CKD, HTN, HLD was admitted with NSTEMI with medically refractory chest pain for which he underwent LHC and is s/p OM1 PCI.  # CAD manifested by NSTEMI with medically refractory chest pain on 06/18/2022 s/p OM1 PCI and prior patent stent in RPDA with normal LVEF -Continue triple therapy with aspirin 81 mg once daily, Plavix 75 mg once daily and Eliquis 5 mg once daily for 1 week.  Drop aspirin, continue Plavix 75 mg once daily and Eliquis 5 mg twice daily for at least 12 months. -Continue atorvastatin 80 mg nightly and Zetia 10 mg once daily -Continue metoprolol succinate 25 mg once daily (stop nebivolol upon discharge) -Obtain BMP in 3 days and resume lisinopril 5 mg once daily if creatinine is within normal limits. -Cardiology outpatient follow-up in 2 weeks -Cardiac rehab referral  # Paroxysmal A-fib -Continue metoprolol succinate 25 mg once daily -Continue Eliquis 5 mg once daily  # PAD s/p femoral artery stenting 2011 -Continue aspirin and high intensity statin  # HTN, controlled -Continue metoprolol succinate 25 mg once daily (stop nebivolol upon discharge) -Obtain BMP in 3 days and resume lisinopril 5 mg once daily if creatinine is within normal limits. -Hold amlodipine and can resume in an outpatient setting  Disposition discharge to home today  I have spent a total of 33 minutes with patient reviewing chart , telemetry, EKGs, labs and examining patient as well as establishing an assessment and plan that was discussed with the patient.  > 50% of time was spent in direct patient care.     Signed, Chalmers Guest, MD  06/19/2022, 9:33 AM

## 2022-06-19 NOTE — Discharge Instructions (Signed)
Our office will arrange a blood test Wed 06/22/2022 at [facility]   Do not take: Lisinopril Amlodipine  Nebivolol  We will let you know if you can resume Lisinopril based upon your lab test.  If your BP increases to > 140/90, you can resume your Amlodipine  You should take aspirin, Clopidogrel (Plavix) and Eliquis. After 7 days, you can stop aspirin only.   You will need to remain on Clopidogrel (Plavix) and Eliquis for at least 12 mos.

## 2022-06-19 NOTE — Plan of Care (Signed)
  Problem: Education: Goal: Understanding of cardiac disease, CV risk reduction, and recovery process will improve Outcome: Progressing   Problem: Activity: Goal: Ability to tolerate increased activity will improve Outcome: Progressing   Problem: Cardiac: Goal: Ability to achieve and maintain adequate cardiovascular perfusion will improve Outcome: Progressing   Problem: Education: Goal: Understanding of CV disease, CV risk reduction, and recovery process will improve Outcome: Progressing   Problem: Activity: Goal: Ability to return to baseline activity level will improve Outcome: Progressing

## 2022-06-19 NOTE — TOC Transition Note (Signed)
Transition of Care [facility]) - CM/SW Discharge Note   Patient Details  Name: Jay Ware MRN: [id number] Date of Birth: 1960/10/17  [facility] CM/SW Contact:  Leone Haven, RN Phone Number: 06/19/2022, 10:12 AM   Clinical Narrative:    Patient is for dc home today, no needs.         Patient Goals and CMS Choice        Discharge Placement                       Discharge Plan and Services                                     Social Determinants of Health (SDOH) Interventions     Readmission Risk Interventions     No data to display

## 2022-06-19 NOTE — Discharge Summary (Addendum)
Discharge Summary    Patient ID: Jay Ware MRN: [id number]; DOB: 04-02-1961  Admit date: 06/18/2022 Discharge date: 06/19/2022  PCP:  Curly Rim, MD   Cardiologist: Marina Goodell MD ([place])  Discharge Diagnoses    Principal Problem:   NSTEMI (non-ST elevated myocardial infarction) [facility]) Active Problems:   CAD (coronary artery disease)   Paroxysmal atrial fibrillation ([place])   Thoracic ascending aortic aneurysm (HCC)   PAD (peripheral artery disease) ([place])   CKD (chronic kidney disease) stage 2, GFR 60-89 ml/min   Essential hypertension   Hyperlipidemia LDL goal <70   OSA (obstructive sleep apnea)    Diagnostic Studies/Procedures    LEFT HEART CATH   06/18/2022 Severe single-vessel coronary artery disease with thrombotic occlusion of moderate-caliber OM1 branch.  Otherwise, there is mild, nonobstructive coronary artery disease involving the LAD and RCA. Widely patent RPDA stent. Upper normal left ventricular filling pressure (LVEDP 15 mmHg). Severe tortuosity of distal left radial artery, successfully navigated with considerable difficulty.  Consider alternative approach for future catheterizations. Successful PCI to OM1 using Synergy 2.5 x 24 mm drug-eluting stent (postdilated to 2.8 mm) with 0% residual stenosis and TIMI-3 flow.  Recommendations: Aggressive secondary prevention of coronary artery disease. Continue dual antiplatelet therapy with aspirin and clopidogrel.  If no evidence of bleeding/vascular injury, apixaban can be restarted as soon as tomorrow.  I recommend aspirin + clopidogrel + apixaban for 1 week, after which time aspirin can be discontinued and clopidogrel + apixaban continued for at least 12 months. Gentle post catheterization hydration. Follow-up echocardiogram.  Nelva Bush, MD [facility]     ECHO COMPLETE WITH IMAGING ENHANCING AGENT 06/18/2022 IMPRESSIONS 1. Left ventricular ejection  fraction, by estimation, is 60 to 65%. The left ventricle has normal function. The left ventricle has no regional wall motion abnormalities. Left ventricular diastolic parameters are consistent with Grade I diastolic dysfunction (impaired relaxation). 2. Right ventricular systolic function is mildly reduced. The right ventricular size is mildly enlarged. 3. The mitral valve is grossly normal. No evidence of mitral valve regurgitation. No evidence of mitral stenosis. 4. The aortic valve is grossly normal. Aortic valve regurgitation is not visualized. No aortic stenosis is present. 5. Aortic dilatation noted. There is moderate dilatation of the aortic root, measuring 48 mm. There is mild dilatation of the ascending aorta, measuring 46 mm. 6. The inferior vena cava is dilated in size with >50% respiratory variability, suggesting right atrial pressure of 8 mmHg.    _____________   History of Present Illness     Jay Ware is a 61 y.o. 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  He presented to [facility] with severe unrelenting chest pain with radiation to his jaw and left arm. His troponins Hs were [id number]. He became frustrated with waiting and left the emergency room AMA. He then came to [facility] for evaluation.  His high-sensitivity troponin at [facility] was elevated at 3257.  He was admitted for further evaluation and management of a non-STEMI.  Hospital Course     Consultants: None  He was started  on IV nitroglycerin and IV heparin.  His apixaban was held.  He was loaded with Plavix.  He was also treated with fentanyl and morphine.  Unfortunately, he continued to have ongoing chest pain despite management efforts.  Therefore, it was decided to proceed with urgent cardiac catheterization.  Of note, his creatinine had increased to 1.47 (1.2 at [facility]).  He was started on IV fluids and his ACE inhibitor was held.  He was taken to the cardiac catheterization lab by Dr. Saunders Revel.  Angiography demonstrated severe single-vessel disease with thrombotic occlusion of a moderate caliber OM1 branch.  Otherwise, there was mild nonobstructive disease in the LAD and RCA.  RPDA stent was widely patent.  LVEDP was 15.  He underwent PCI with 2.5 x 24 mm Synergy DES to the OM1.  It was recommended that the patient remain on aspirin, Plavix and Eliquis for 1 week.  After 1 week, aspirin can be discontinued and he would need to remain on Plavix plus Eliquis for at least 12 months. Echocardiogram demonstrated EF 60-65 with no wall motion abnormalities and mild diastolic dysfunction RV systolic function was mildly reduced. There was moderate dilation of the aortic root at 48 mm and mild dilation of ascending aorta at 46 mm (CT at [facility] 08/04/2021: Ascending thoracic aorta 4.2 cm).  He was evaluated by Dr. Dellia Cloud this morning.  The patient is doing well without recurrent symptoms.  He is felt to be stable and ready for discharge to home.  He was previously on nebivolol at home.  We have changed that to Toprol-XL 25 mg daily here.  We have held his lisinopril.  We will arrange a follow-up BMET in 3 days.  As long as his creatinine improves, we will notify him to resume his lisinopril.  His blood pressure has been soft and his amlodipine was held on admission.  This will be held at discharge.  It can be resumed as an outpatient if his blood pressure starts to increase.  He will need follow-up with his cardiologist at [facility] in the  next 1 to 2 weeks.     Did the patient have an acute coronary syndrome (MI, NSTEMI, STEMI, etc) this admission?:  Yes                               AHA/ACC Clinical Performance & Quality Measures: Aspirin prescribed? - Yes ADP Receptor Inhibitor (Plavix/Clopidogrel, Brilinta/Ticagrelor or Effient/Prasugrel) prescribed (includes medically managed patients)? - Yes Beta Blocker prescribed? - Yes High Intensity Statin (Lipitor 40-32m or Crestor 20-477m prescribed? - Yes EF assessed during THIS hospitalization? - Yes For EF <40%, was ACEI/ARB prescribed? - No - Reason:  AKI For EF <40%, Aldosterone Antagonist (Spironolactone or Eplerenone) prescribed? - Not Applicable (EF >/= 4088%Cardiac Rehab Phase II ordered (including medically managed patients)? - Yes   The patient will need be scheduled for a TOC [facility]  This DC summary will be faxed to his Cardiologist.  _____________  Discharge Vitals Blood pressure 110/69, pulse 99, temperature 98.3 F (36.8 C), temperature source Oral, resp. rate 15, height _0  (1.854 m), weight (!) 140.1 kg, SpO2 96 %.  Filed Weights   06/18/22 0725 06/19/22 0407  Weight: (!) 138.3 kg (!) 140.1 kg    Labs & Radiologic Studies    CBC Recent Labs    06/18/22 0459 06/19/22 0017  WBC 9.3 8.2  HGB 15.4 13.8  HCT 45.2 41.6  MCV 85.1 85.4  PLT 241 300   Basic Metabolic Panel Recent Labs    06/18/22 0459 06/19/22 0017  NA 140 138  K 4.4 4.0  CL 106 107  CO2 26 25  GLUCOSE 97 94  BUN 15 15  CREATININE 1.47* 1.36*  CALCIUM 9.8 8.6*    High Sensitivity Troponin:   Recent Labs  Lab 06/18/22 0459 06/18/22 0729  TROPONINIHS 3,257* 7,811*     Fasting Lipid Panel Recent Labs    06/18/22 0729  CHOL 78  HDL 32*  LDLCALC 33  TRIG 67  CHOLHDL 2.4   Thyroid Function Tests Recent Labs    06/18/22 0729  TSH 2.233   _____________  DG Chest 2 View  Result Date:  06/18/2022 CLINICAL DATA:  61 year old male with chest pain. EXAM: CHEST - 2 VIEW COMPARISON:  CTA chest 05/08/2010 and earlier.  FINDINGS: AP and lateral views at 0541 hours. Mildly lower lung volumes compared to 2011. Mediastinal contours are stable and within normal limits. Visualized tracheal air column is within normal limits. No pneumothorax or pulmonary edema. No pleural effusion or consolidation. Crowding of lung base markings on the lateral view. No acute osseous abnormality identified. Paucity of bowel gas in the visible abdomen.  IMPRESSION: Lower lung volumes with mild atelectasis. No other acute cardiopulmonary abnormality.  Electronically Signed   By: Genevie Ann M.D.   On: 06/18/2022 06:23     Disposition   Pt is being discharged home today in good condition.  Follow-up Plans & Appointments     Follow-up Information     Marina Goodell MD ([place]). Call in 1 week(s).   Why: Call to schedule an appt in 1 week        [place] Follow up in 3 day(s).   Specialty: Cardiology Why: Our office will arrange a lab test (BMET) Contact information: [address] [place] [id number] [place] [phone]               Discharge Instructions     AMB Referral to Cardiac Rehabilitation - Phase II   Complete by: As directed    Diagnosis:  Coronary Stents NSTEMI     After initial evaluation and assessments completed: Virtual Based Care may be provided alone or in conjunction with Phase 2 Cardiac Rehab based on patient barriers.: Yes   Intensive Cardiac Rehabilitation (ICR) [place] location only OR Traditional Cardiac Rehabilitation (TCR) *If criteria for ICR are not met will enroll in TCR [facility] only): Yes   Diet - low sodium heart healthy   Complete by: As directed    Discharge wound care:   Complete by: As directed    Call [phone] or your  cardiologist for any swelling, bleeding, bruising or fever.   Driving Restrictions   Complete by: As directed    No driving for 2 weeks   Increase activity slowly   Complete by: As directed    Lifting restrictions   Complete by: As directed    No lifting over 5 lbs for 2 weeks   Sexual Activity Restrictions   Complete by: As directed    None for 2 weeks        Discharge Medications   Allergies as of 06/19/2022   No Known Allergies      Medication List     STOP taking  these medications    amLODipine 5 MG tablet Commonly known as: NORVASC   amoxicillin-clavulanate 875-125 MG tablet Commonly known as: Augmentin   lisinopril 5 MG tablet Commonly known as: ZESTRIL   methocarbamol 500 MG tablet Commonly known as: ROBAXIN   nebivolol 5 MG tablet Commonly known as: BYSTOLIC   oxyCODONE-acetaminophen 5-325 MG tablet Commonly known as: PERCOCET/ROXICET       TAKE these medications    acetaminophen 325 MG tablet Commonly known as: TYLENOL Take 2 tablets (650 mg total) by mouth every 6 (six) hours as needed for headache or mild pain.   aspirin EC 81 MG tablet Take 1 daily for 7 days, then stop. What changed:  how much to take how to take this when to take this additional instructions   atorvastatin 80 MG tablet Commonly known as: LIPITOR Take 80 mg by mouth at bedtime.   clopidogrel 75 MG tablet Commonly known as: PLAVIX Take 1 tablet (75 mg total) by mouth daily. Start taking on: June 20, 2022   Eliquis 5 MG Tabs tablet Generic drug: apixaban Take 1 tablet by mouth 2 (two) times daily.   ezetimibe 10 MG tablet Commonly known as: ZETIA Take 10 mg by mouth daily.   lidocaine 5 % Commonly known as: Lidoderm Place 1 patch onto the skin daily. Remove & Discard patch within 12 hours or as directed by MD   metoprolol succinate 25 MG 24 hr tablet Commonly known as: TOPROL-XL Take 1 tablet (25 mg total) by mouth daily. Start taking on: June 20, 2022   nicotine 14 mg/24hr patch Commonly known as: NICODERM CQ - dosed in mg/24 hours Place 1 patch (14 mg total) onto the skin daily.   nitroGLYCERIN 0.3 MG SL tablet Commonly known as: NITROSTAT Place 0.3 mg under the tongue every 5 (five) minutes as needed for chest pain.               Discharge Care Instructions  (From admission, onward)           Start     Ordered   06/19/22 0000  Discharge wound care:       Comments: Call [phone] or your cardiologist for any swelling, bleeding, bruising or fever.   06/19/22 0959            Outstanding Labs/Studies   BMET 3 days after DC   Duration of Discharge Encounter   Greater than 30 minutes including physician time.  Signed, Nhia Heaphy Fidel Levy, MD [place]

## 2022-06-19 NOTE — Progress Notes (Signed)
Patient refused niv 

## 2022-06-20 ENCOUNTER — Telehealth (HOSPITAL_COMMUNITY): Payer: Self-pay

## 2022-06-20 ENCOUNTER — Encounter (HOSPITAL_COMMUNITY): Payer: Self-pay | Admitting: Internal Medicine

## 2022-06-20 ENCOUNTER — Telehealth: Payer: Self-pay | Admitting: Physician Assistant

## 2022-06-20 DIAGNOSIS — Z955 Presence of coronary angioplasty implant and graft: Secondary | ICD-10-CM

## 2022-06-20 DIAGNOSIS — I214 Non-ST elevation (NSTEMI) myocardial infarction: Secondary | ICD-10-CM

## 2022-06-20 LAB — HEMOGLOBIN A1C
Hgb A1c MFr Bld: 5 % (ref 4.8–5.6)
Mean Plasma Glucose: 97 mg/dL

## 2022-06-20 NOTE — Telephone Encounter (Signed)
Pt was educated on antiplatelet med use, wt restrictions, no baths/daily wash-ups, s/s of infection, ex guidelines (progressive walking and resistance exercise), s/s to stop exercising, NTG use and calling 911, heart healthy diet,  and CRPII. Will send MI book and materials on exercise and diet. Will refer to [facility].    [phone]  Jay Ware 06/20/2022 11:23 AM

## 2022-06-20 NOTE — Telephone Encounter (Signed)
  Pt c/o medication issue:  1. Name of Medication: aspirin EC 81 MG tablet   metoprolol succinate (TOPROL-XL) 25 MG 24 hr tablet    2. How are you currently taking this medication (dosage and times per day)?   3. Are you having a reaction (difficulty breathing--STAT)? No   4. What is your medication issue? Amy with [facility] calling, she would like to clarify the script for these two medications.  She said to choose option 2 for [place] location

## 2022-06-20 NOTE — Telephone Encounter (Signed)
Returned call to [employer] [facility] [employer].  Clarification has been given for Aspirin and Metoprolol.  She is aware pt will take Aspirin 81 mg X's 7 days then stop and that Nebivolol has been discontinued and started Metoprolol XL 25 mg.   She was grateful for the call back.

## 2022-06-22 ENCOUNTER — Ambulatory Visit: Payer: Medicaid Other | Attending: Physician Assistant

## 2022-06-22 ENCOUNTER — Encounter: Payer: Self-pay | Admitting: *Deleted

## 2022-06-22 DIAGNOSIS — N179 Acute kidney failure, unspecified: Secondary | ICD-10-CM

## 2022-06-22 LAB — BASIC METABOLIC PANEL
BUN/Creatinine Ratio: 12 (ref 10–24)
BUN: 18 mg/dL (ref 8–27)
CO2: 22 mmol/L (ref 20–29)
Calcium: 9.4 mg/dL (ref 8.6–10.2)
Chloride: 105 mmol/L (ref 96–106)
Creatinine, Ser: 1.49 mg/dL — ABNORMAL HIGH (ref 0.76–1.27)
Glucose: 85 mg/dL (ref 70–99)
Potassium: 4.4 mmol/L (ref 3.5–5.2)
Sodium: 140 mmol/L (ref 134–144)
eGFR: 53 mL/min/{1.73_m2} — ABNORMAL LOW (ref >59)

## 2022-06-22 LAB — LIPOPROTEIN A (LPA): Lipoprotein (a): 9 nmol/L (ref <75.0)

## 2022-06-24 ENCOUNTER — Telehealth (HOSPITAL_COMMUNITY): Payer: Self-pay

## 2022-06-24 NOTE — Telephone Encounter (Signed)
Attempted to call patient in regards to Cardiac Rehab - LM on VM 

## 2022-06-30 ENCOUNTER — Telehealth: Payer: Self-pay | Admitting: *Deleted

## 2022-06-30 NOTE — Telephone Encounter (Signed)
Call placed to pt.  He states he has been waiting on a call back from his Cardiologist's office.  Pt was advised that he should be seen this week, or next week the latest, and that he would need to have a repeat BMET.  I did advise pt to make sure they are aware he was recently hospitalized and he needed to be seen ASAP.  Pt states he will call them back.

## 2022-06-30 NOTE — Telephone Encounter (Signed)
-----   Message from Beatrice Lecher, [place] sent at 06/30/2022  8:08 AM EST ----- Please contact patient and make sure he has arranged f/u with his Cardiologist at [facility] soon. He needs a post hospital follow up. It should have been this week (next week at the latest). He will need to have a BMET repeated at his f/u with his Cardiologist.  Tereso Newcomer, PA-C    06/30/2022 8:06 AM

## 2024-06-12 ENCOUNTER — Observation Stay (HOSPITAL_COMMUNITY)

## 2024-06-12 ENCOUNTER — Other Ambulatory Visit: Payer: Self-pay

## 2024-06-12 ENCOUNTER — Inpatient Hospital Stay (HOSPITAL_COMMUNITY)
Admission: EM | Admit: 2024-06-12 | Discharge: 2024-06-14 | DRG: 291 | Disposition: A | Discharge status: Home / Self Care | Attending: Internal Medicine | Admitting: Internal Medicine

## 2024-06-12 ENCOUNTER — Emergency Department (HOSPITAL_COMMUNITY)

## 2024-06-12 DIAGNOSIS — I480 Paroxysmal atrial fibrillation: Secondary | ICD-10-CM | POA: Diagnosis present

## 2024-06-12 DIAGNOSIS — I7121 Aneurysm of the ascending aorta, without rupture: Secondary | ICD-10-CM | POA: Diagnosis present

## 2024-06-12 DIAGNOSIS — I4891 Unspecified atrial fibrillation: Principal | ICD-10-CM | POA: Diagnosis present

## 2024-06-12 DIAGNOSIS — G4733 Obstructive sleep apnea (adult) (pediatric): Secondary | ICD-10-CM | POA: Diagnosis present

## 2024-06-12 DIAGNOSIS — I2510 Atherosclerotic heart disease of native coronary artery without angina pectoris: Secondary | ICD-10-CM | POA: Diagnosis present

## 2024-06-12 DIAGNOSIS — N182 Chronic kidney disease, stage 2 (mild): Secondary | ICD-10-CM | POA: Diagnosis present

## 2024-06-12 DIAGNOSIS — I10 Essential (primary) hypertension: Secondary | ICD-10-CM | POA: Diagnosis present

## 2024-06-12 DIAGNOSIS — E785 Hyperlipidemia, unspecified: Secondary | ICD-10-CM | POA: Diagnosis present

## 2024-06-12 LAB — PROTIME-INR
INR: 1.2 (ref 0.8–1.2)
Prothrombin Time: 15.9 s — ABNORMAL HIGH (ref 11.4–15.2)

## 2024-06-12 LAB — CBC
HCT: 48 % (ref 39.0–52.0)
Hemoglobin: 15.8 g/dL (ref 13.0–17.0)
MCH: 29.5 pg (ref 26.0–34.0)
MCHC: 32.9 g/dL (ref 30.0–36.0)
MCV: 89.6 fL (ref 80.0–100.0)
Platelets: 203 K/uL (ref 150–400)
RBC: 5.36 MIL/uL (ref 4.22–5.81)
RDW: 13.3 % (ref 11.5–15.5)
WBC: 10.9 K/uL — ABNORMAL HIGH (ref 4.0–10.5)
nRBC: 0 % (ref 0.0–0.2)

## 2024-06-12 LAB — BASIC METABOLIC PANEL WITH GFR
Anion gap: 6 (ref 5–15)
BUN: 18 mg/dL (ref 8–23)
CO2: 27 mmol/L (ref 22–32)
Calcium: 8.6 mg/dL — ABNORMAL LOW (ref 8.9–10.3)
Chloride: 106 mmol/L (ref 98–111)
Creatinine, Ser: 1.43 mg/dL — ABNORMAL HIGH (ref 0.61–1.24)
GFR, Estimated: 55 mL/min — ABNORMAL LOW (ref >60)
Glucose, Bld: 110 mg/dL — ABNORMAL HIGH (ref 70–99)
Potassium: 4.5 mmol/L (ref 3.5–5.1)
Sodium: 139 mmol/L (ref 135–145)

## 2024-06-12 LAB — HEPATIC FUNCTION PANEL
ALT: 66 U/L — ABNORMAL HIGH (ref 0–44)
AST: 36 U/L (ref 15–41)
Albumin: 3 g/dL — ABNORMAL LOW (ref 3.5–5.0)
Alkaline Phosphatase: 57 U/L (ref 38–126)
Bilirubin, Direct: 0.3 mg/dL — ABNORMAL HIGH (ref 0.0–0.2)
Indirect Bilirubin: 0.6 mg/dL (ref 0.3–0.9)
Total Bilirubin: 0.9 mg/dL (ref 0.0–1.2)
Total Protein: 5.9 g/dL — ABNORMAL LOW (ref 6.5–8.1)

## 2024-06-12 LAB — TROPONIN I (HIGH SENSITIVITY)
Troponin I (High Sensitivity): 15 ng/L (ref <18)
Troponin I (High Sensitivity): 19 ng/L — ABNORMAL HIGH (ref <18)

## 2024-06-12 LAB — MAGNESIUM: Magnesium: 1.9 mg/dL (ref 1.7–2.4)

## 2024-06-12 LAB — TSH: TSH: 0.936 u[IU]/mL (ref 0.350–4.500)

## 2024-06-12 LAB — HIV ANTIBODY (ROUTINE TESTING W REFLEX): HIV Screen 4th Generation wRfx: NONREACTIVE

## 2024-06-12 LAB — PHOSPHORUS: Phosphorus: 3.2 mg/dL (ref 2.5–4.6)

## 2024-06-12 MED ORDER — NITROGLYCERIN 0.4 MG SL SUBL: 0.4000 mg | SUBLINGUAL_TABLET | SUBLINGUAL | Status: DC | PRN

## 2024-06-12 MED ORDER — APIXABAN 5 MG PO TABS
5.0000 mg | ORAL_TABLET | Freq: Two times a day (BID) | ORAL | Status: DC
  Administered 2024-06-12 – 2024-06-13 (×3): 5 mg via ORAL
  Filled 2024-06-12 (×3): qty 1

## 2024-06-12 MED ORDER — BISACODYL 5 MG PO TBEC: 5.0000 mg | DELAYED_RELEASE_TABLET | Freq: Every day | ORAL | Status: DC | PRN

## 2024-06-12 MED ORDER — IPRATROPIUM BROMIDE 0.02 % IN SOLN: 0.5000 mg | Freq: Four times a day (QID) | RESPIRATORY_TRACT | Status: DC | PRN

## 2024-06-12 MED ORDER — SODIUM CHLORIDE 0.9 % IV BOLUS
1000.0000 mL | Freq: Once | INTRAVENOUS | Status: AC
  Administered 2024-06-12: 1000 mL via INTRAVENOUS

## 2024-06-12 MED ORDER — SODIUM CHLORIDE 0.9 % IV SOLN: INTRAVENOUS | Status: DC

## 2024-06-12 MED ORDER — ACETAMINOPHEN 650 MG RE SUPP: 650.0000 mg | Freq: Four times a day (QID) | RECTAL | Status: DC | PRN

## 2024-06-12 MED ORDER — METOPROLOL SUCCINATE ER 25 MG PO TB24: 50.0000 mg | ORAL_TABLET | Freq: Every day | ORAL | Status: DC

## 2024-06-12 MED ORDER — ONDANSETRON HCL 4 MG PO TABS: 4.0000 mg | ORAL_TABLET | Freq: Four times a day (QID) | ORAL | Status: DC | PRN

## 2024-06-12 MED ORDER — FLEET ENEMA RE ENEM: 1.0000 | ENEMA | Freq: Once | RECTAL | Status: DC | PRN

## 2024-06-12 MED ORDER — METOPROLOL SUCCINATE ER 25 MG PO TB24: 25.0000 mg | ORAL_TABLET | Freq: Every day | ORAL | Status: DC

## 2024-06-12 MED ORDER — HYDROMORPHONE HCL 1 MG/ML IJ SOLN: 0.5000 mg | INTRAMUSCULAR | Status: DC | PRN

## 2024-06-12 MED ORDER — HYDRALAZINE HCL 20 MG/ML IJ SOLN: 10.0000 mg | INTRAMUSCULAR | Status: DC | PRN

## 2024-06-12 MED ORDER — ATORVASTATIN CALCIUM 40 MG PO TABS: 80.0000 mg | ORAL_TABLET | Freq: Every day | ORAL | Status: DC

## 2024-06-12 MED ORDER — EZETIMIBE 10 MG PO TABS
10.0000 mg | ORAL_TABLET | Freq: Every day | ORAL | Status: DC
  Administered 2024-06-12 – 2024-06-14 (×3): 10 mg via ORAL
  Filled 2024-06-12 (×3): qty 1

## 2024-06-12 MED ORDER — SENNOSIDES-DOCUSATE SODIUM 8.6-50 MG PO TABS: 1.0000 | ORAL_TABLET | Freq: Every evening | ORAL | Status: DC | PRN

## 2024-06-12 MED ORDER — SODIUM CHLORIDE 0.9% FLUSH
3.0000 mL | Freq: Two times a day (BID) | INTRAVENOUS | Status: DC
  Administered 2024-06-12 – 2024-06-14 (×4): 3 mL via INTRAVENOUS

## 2024-06-12 MED ORDER — OXYCODONE HCL 5 MG PO TABS: 5.0000 mg | ORAL_TABLET | ORAL | Status: DC | PRN

## 2024-06-12 MED ORDER — DILTIAZEM HCL 25 MG/5ML IV SOLN
20.0000 mg | Freq: Once | INTRAVENOUS | Status: AC
  Administered 2024-06-12: 20 mg via INTRAVENOUS
  Filled 2024-06-12: qty 5

## 2024-06-12 MED ORDER — ONDANSETRON HCL 4 MG/2ML IJ SOLN: 4.0000 mg | Freq: Four times a day (QID) | INTRAMUSCULAR | Status: DC | PRN

## 2024-06-12 MED ORDER — ATORVASTATIN CALCIUM 80 MG PO TABS
80.0000 mg | ORAL_TABLET | Freq: Every day | ORAL | Status: DC
  Administered 2024-06-12 – 2024-06-13 (×2): 80 mg via ORAL
  Filled 2024-06-12 (×2): qty 1

## 2024-06-12 MED ORDER — IOHEXOL 350 MG/ML SOLN
75.0000 mL | Freq: Once | INTRAVENOUS | Status: AC | PRN
  Administered 2024-06-12: 75 mL via INTRAVENOUS

## 2024-06-12 MED ORDER — ZOLPIDEM TARTRATE 5 MG PO TABS
5.0000 mg | ORAL_TABLET | Freq: Every evening | ORAL | Status: DC | PRN
  Administered 2024-06-12 – 2024-06-13 (×2): 5 mg via ORAL
  Filled 2024-06-12 (×2): qty 1

## 2024-06-12 MED ORDER — SODIUM CHLORIDE 0.9% FLUSH
3.0000 mL | Freq: Two times a day (BID) | INTRAVENOUS | Status: DC
  Administered 2024-06-12 – 2024-06-14 (×2): 3 mL via INTRAVENOUS

## 2024-06-12 MED ORDER — DILTIAZEM HCL-DEXTROSE 125-5 MG/125ML-% IV SOLN (PREMIX)
5.0000 mg/h | INTRAVENOUS | Status: DC
  Administered 2024-06-12: 5 mg/h via INTRAVENOUS
  Administered 2024-06-13 (×2): 12.5 mg/h via INTRAVENOUS
  Filled 2024-06-12 (×4): qty 125

## 2024-06-12 MED ORDER — HEPARIN SODIUM (PORCINE) 5000 UNIT/ML IJ SOLN: 5000.0000 [IU] | Freq: Three times a day (TID) | INTRAMUSCULAR | Status: DC

## 2024-06-12 MED ORDER — ATORVASTATIN CALCIUM 40 MG PO TABS: 40.0000 mg | ORAL_TABLET | Freq: Every day | ORAL | Status: DC

## 2024-06-12 MED ORDER — NICOTINE 14 MG/24HR TD PT24
14.0000 mg | MEDICATED_PATCH | Freq: Every day | TRANSDERMAL | Status: DC
  Administered 2024-06-13: 14 mg via TRANSDERMAL
  Filled 2024-06-12 (×3): qty 1

## 2024-06-12 MED ORDER — ACETAMINOPHEN 325 MG PO TABS: 650.0000 mg | ORAL_TABLET | Freq: Four times a day (QID) | ORAL | Status: DC | PRN

## 2024-06-12 MED ORDER — FUROSEMIDE 10 MG/ML IJ SOLN
80.0000 mg | Freq: Once | INTRAMUSCULAR | Status: AC
  Administered 2024-06-12: 80 mg via INTRAVENOUS
  Filled 2024-06-12: qty 8

## 2024-06-12 NOTE — ED Provider Notes (Signed)
 [facility] AT [facility] Provider Note   CSN: [id number] Arrival date & time: 06/12/24  1310     Patient presents with: Chest Pain  HPI Jay Ware is a 63 y.o. male with paroxysmal A-fib not currently taking his medications, CAD, H/O NSTEMI, CKD, hypertension, TAA presenting for shortness of breath.  He states his symptoms started a couple days ago.  They are worse with exertion and at times lying flat.  He also mentioned that he has had some chest tightness in the center of his chest but does not radiate to his back.  No chest pain at this time.  He reports that he has not been taking his Eliquis  or his metoprolol  and possibly his Plavix  for a couple of months.    Chest Pain      Prior to Admission medications  Medication Sig Start Date End Date Taking? Authorizing Provider  acetaminophen  (TYLENOL ) 325 MG tablet Take 2 tablets (650 mg total) by mouth every 6 (six) hours as needed for headache or mild pain. Patient not taking: Reported on 06/12/2024 06/19/22   Lelon Hamilton T, PA-C  atorvastatin  (LIPITOR ) 80 MG tablet Take 80 mg by mouth at bedtime. Patient not taking: Reported on 06/12/2024    [provider]  clopidogrel  (PLAVIX ) 75 MG tablet Take 1 tablet (75 mg total) by mouth daily. Patient not taking: Reported on 06/12/2024 06/20/22   Lelon Hamilton T, PA-C  ELIQUIS  5 MG TABS tablet Take 1 tablet by mouth 2 (two) times daily. Patient not taking: Reported on 06/12/2024 06/13/22   [provider]  ezetimibe  (ZETIA ) 10 MG tablet Take 10 mg by mouth daily. Patient not taking: Reported on 06/12/2024    [provider]  lidocaine  (LIDODERM ) 5 % Place 1 patch onto the skin daily. Remove & Discard patch within 12 hours or as directed by MD Patient not taking: Reported on 06/18/2022 04/20/22   Neldon Hamp GORMAN, PA  metoprolol  succinate (TOPROL -XL) 25 MG 24 hr tablet Take 1 tablet (25 mg total) by mouth daily. Patient not  taking: Reported on 06/12/2024 06/20/22   Lelon Hamilton T, PA-C  nicotine  (NICODERM CQ  - DOSED IN MG/24 HOURS) 14 mg/24hr patch Place 1 patch (14 mg total) onto the skin daily. Patient not taking: Reported on 06/12/2024 06/19/22   Lelon Hamilton T, PA-C  nitroGLYCERIN  (NITROSTAT ) 0.3 MG SL tablet Place 0.3 mg under the tongue every 5 (five) minutes as needed for chest pain. Patient not taking: Reported on 06/12/2024 06/11/20   [provider]    Allergies: Patient has no known allergies.    Review of Systems  Cardiovascular:  Positive for chest pain.    Updated Vital Signs BP 119/85 (BP Location: Left Arm)   Pulse (!) 54   Temp 98.6 F (37 C) (Oral)   Resp (!) 21   Ht 6' 1 (1.854 m)   Wt 134.9 kg   SpO2 93%   BMI 39.24 kg/m   Physical Exam Vitals and nursing note reviewed.  HENT:     Head: Normocephalic and atraumatic.     Mouth/Throat:     Mouth: Mucous membranes are moist.  Eyes:     General:        Right eye: No discharge.        Left eye: No discharge.     Conjunctiva/sclera: Conjunctivae normal.  Cardiovascular:     Rate and Rhythm: Tachycardia present. Rhythm irregular.     Pulses: Normal pulses.  Radial pulses are 2+ on the right side and 2+ on the left side.     Heart sounds: Normal heart sounds.  Pulmonary:     Effort: Pulmonary effort is normal.     Breath sounds: Normal breath sounds.  Abdominal:     General: Abdomen is flat.     Palpations: Abdomen is soft.  Skin:    General: Skin is warm and dry.  Neurological:     General: No focal deficit present.  Psychiatric:        Mood and Affect: Mood normal.     (all labs ordered are listed, but only abnormal results are displayed) Labs Reviewed  BASIC METABOLIC PANEL WITH GFR - Abnormal; Notable for the following components:      Result Value   Glucose, Bld 110 (*)    Creatinine, Ser 1.43 (*)    Calcium  8.6 (*)    GFR, Estimated 55 (*)    All other components within normal limits   CBC - Abnormal; Notable for the following components:   WBC 10.9 (*)    All other components within normal limits  PROTIME-INR - Abnormal; Notable for the following components:   Prothrombin Time 15.9 (*)    All other components within normal limits  HEPATIC FUNCTION PANEL - Abnormal; Notable for the following components:   Total Protein 5.9 (*)    Albumin 3.0 (*)    ALT 66 (*)    Bilirubin, Direct 0.3 (*)    All other components within normal limits  LIPID PANEL - Abnormal; Notable for the following components:   HDL 38 (*)    All other components within normal limits  COMPREHENSIVE METABOLIC PANEL WITH GFR - Abnormal; Notable for the following components:   Creatinine, Ser 1.45 (*)    Calcium  8.4 (*)    Total Protein 6.3 (*)    Albumin 3.3 (*)    ALT 67 (*)    Total Bilirubin 1.7 (*)    GFR, Estimated 54 (*)    All other components within normal limits  GLUCOSE, CAPILLARY - Abnormal; Notable for the following components:   Glucose-Capillary 106 (*)    All other components within normal limits  TROPONIN I (HIGH SENSITIVITY) - Abnormal; Notable for the following components:   Troponin I (High Sensitivity) 19 (*)    All other components within normal limits  EXPECTORATED SPUTUM ASSESSMENT W GRAM STAIN, RFLX TO RESP C  MAGNESIUM  HIV ANTIBODY (ROUTINE TESTING W REFLEX)  PHOSPHORUS  TSH  TROPONIN I (HIGH SENSITIVITY)    EKG: EKG Interpretation Date/Time:  Wednesday June 12 2024 13:19:07 EST Ventricular Rate:  142 PR Interval:    QRS Duration:  102 QT Interval:  336 QTC Calculation: 516 R Axis:   -41  Text Interpretation: Atrial fibrillation with rapid ventricular response Left axis deviation Inferior infarct , age undetermined Abnormal ECG When compared with ECG of 19-Jun-2022 07:14, PREVIOUS ECG IS PRESENT Confirmed by Dasie Faden ([id number]) on 06/13/2024 2:38:16 PM  Radiology: ECHOCARDIOGRAM COMPLETE Result Date: 06/13/2024    ECHOCARDIOGRAM REPORT   Patient  Name:   Jay Ware [facility] Date of Exam: 06/13/2024 Medical Rec #:  [id number]       Height:       73.0 in Accession #:    [id number]      Weight:       297.4 lb Date of Birth:  09-Dec-1960      BSA:          2.547  m Patient Age:    63 years        BP:           110/89 mmHg Patient Gender: M               HR:           105 bpm. Exam Location:  Inpatient Procedure: 2D Echo, Cardiac Doppler and Color Doppler (Both Spectral and Color            Flow Doppler were utilized during procedure). Indications:    Atrial Fibrilliation, Dissection of thoracic aorta  History:        Patient has prior history of Echocardiogram examinations, most                 recent 06/18/2022. CAD and Previous Myocardial Infarction,                 Arrythmias:Atrial Fibrillation; Risk Factors:Hypertension,                 Dyslipidemia, Sleep Apnea and Former Smoker.  Sonographer:    Juliene Rucks Referring Phys: [phone] SEYED A SHAHMEHDI  Sonographer Comments: Patient is obese and Technically difficult study due to poor echo windows. Image acquisition challenging due to uncooperative patient, Image acquisition challenging due to patient body habitus and Image acquisition challenging due to respiratory motion. IMPRESSIONS  1. Poor endocardial visualization no definity  given . Left ventricular ejection fraction, by estimation, is 25 to 30%. The left ventricle has severely decreased function. The left ventricle demonstrates global hypokinesis. The left ventricular internal cavity size was moderately dilated. There is mild left ventricular hypertrophy. Left ventricular diastolic parameters are indeterminate.  2. Right ventricular systolic function is normal. The right ventricular size is normal.  3. The mitral valve is abnormal. Trivial mitral valve regurgitation. No evidence of mitral stenosis.  4. The aortic valve is normal in structure. Aortic valve regurgitation is not visualized. No aortic stenosis is present.  5. Aortic dilatation noted. There is  mild dilatation of the aortic root, measuring 38 mm.  6. The inferior vena cava is normal in size with greater than 50% respiratory variability, suggesting right atrial pressure of 3 mmHg. FINDINGS  Left Ventricle: Poor endocardial visualization no definity  given. Left ventricular ejection fraction, by estimation, is 25 to 30%. The left ventricle has severely decreased function. The left ventricle demonstrates global hypokinesis. Strain was performed and the global longitudinal strain is indeterminate. The left ventricular internal cavity size was moderately dilated. There is mild left ventricular hypertrophy. Left ventricular diastolic parameters are indeterminate. Right Ventricle: The right ventricular size is normal. No increase in right ventricular wall thickness. Right ventricular systolic function is normal. Left Atrium: Left atrial size was normal in size. Right Atrium: Right atrial size was normal in size. Pericardium: Trivial pericardial effusion is present. The pericardial effusion is anterior to the right ventricle. Mitral Valve: The mitral valve is abnormal. There is mild thickening of the mitral valve leaflet(s). There is mild calcification of the mitral valve leaflet(s). Trivial mitral valve regurgitation. No evidence of mitral valve stenosis. Tricuspid Valve: The tricuspid valve is normal in structure. Tricuspid valve regurgitation is not demonstrated. No evidence of tricuspid stenosis. Aortic Valve: The aortic valve is normal in structure. Aortic valve regurgitation is not visualized. No aortic stenosis is present. Pulmonic Valve: The pulmonic valve was normal in structure. Pulmonic valve regurgitation is not visualized. No evidence of pulmonic stenosis. Aorta: Aortic dilatation noted. There is mild dilatation of  the aortic root, measuring 38 mm. Venous: The inferior vena cava is normal in size with greater than 50% respiratory variability, suggesting right atrial pressure of 3 mmHg. IAS/Shunts: No  atrial level shunt detected by color flow Doppler. Additional Comments: 3D was performed not requiring image post processing on an independent workstation and was indeterminate.  LEFT VENTRICLE PLAX 2D LVIDd:         6.00 cm      Diastology LVIDs:         5.30 cm      LV e' medial:  9.03 cm/s LV PW:         1.30 cm      LV e' lateral: 13.40 cm/s LV IVS:        1.20 cm LVOT diam:     2.10 cm LV SV:         55 LV SV Index:   22 LVOT Area:     3.46 cm  LV Volumes (MOD) LV vol d, MOD A4C: 282.0 ml LV vol s, MOD A4C: 202.0 ml LV SV MOD A4C:     282.0 ml RIGHT VENTRICLE RV Basal diam:  3.20 cm RV Mid diam:    2.30 cm LEFT ATRIUM           Index        RIGHT ATRIUM           Index LA diam:      3.00 cm 1.18 cm/m   RA Area:     18.10 cm LA Vol (A2C): 44.3 ml 17.39 ml/m  RA Volume:   49.90 ml  19.59 ml/m LA Vol (A4C): 56.7 ml 22.26 ml/m  AORTIC VALVE LVOT Vmax:   108.00 cm/s LVOT Vmean:  70.600 cm/s LVOT VTI:    0.160 m  AORTA Ao Root diam: 3.80 cm  SHUNTS Systemic VTI:  0.16 m Systemic Diam: 2.10 cm Maude Emmer MD Electronically signed by Maude Emmer MD Signature Date/Time: 06/13/2024/11:08:00 AM    Final    CT ANGIO CHEST AORTA W/CM & OR WO/CM Addendum Date: 06/12/2024 **ADDENDUM #1 ** ADDENDUM: On additional review, there are segmental/subsegmental pulmonary emboli in the left lower lobe pulmonary arteries (for example, image 73). Overall clot burden is small. No evidence of right heart strain. These results will be called to the ordering clinician or representative by the Radiologist Assistant, and communication documented in the PACS or [facility] Dashboard. ---------------------------------------------------- Electronically signed by: Pinkie Pebbles MD 06/12/2024 11:41 PM EST RP Workstation: [id number]   Result Date: 06/12/2024 ** ORIGINAL REPORT ** EXAM: CTA CHEST AORTA 06/12/2024 11:05:48 PM TECHNIQUE: CTA of the chest was performed after the administration of 75 mL of iohexol  (OMNIPAQUE ) 350 MG/ML  injection. Multiplanar reformatted images are provided for review. MIP images are provided for review. Automated exposure control, iterative reconstruction, and/or weight based adjustment of the mA/kV was utilized to reduce the radiation dose to as low as reasonably achievable. COMPARISON: 05/08/2010 CLINICAL HISTORY: Pulmonary hypertension suspected; Chest pain, shortness of breath, history of TAA, evaluating for PE and size of TAA. FINDINGS: AORTA: 4.4 cm ascending thoracic aortic aneurysm (image 74), unchanged from remote prior. No evidence of thoracic aortic dissection. MEDIASTINUM: No mediastinal lymphadenopathy. The heart and pericardium demonstrate no acute abnormality. LYMPH NODES: No mediastinal, hilar or axillary lymphadenopathy. LUNGS AND PLEURA: No evidence of pulmonary embolism to the lobar level. The lungs are without acute process. No focal consolidation or pulmonary edema. Trace bilateral pleural effusions, right greater than left. No pneumothorax.  UPPER ABDOMEN: Limited images of the upper abdomen are unremarkable. SOFT TISSUES AND BONES: No acute bone or soft tissue abnormality. IMPRESSION: 1. Stable 4.4 cm ascending thoracic aortic aneurysm. No evidence of thoracic aortic dissection. Consider annual follow-up. 2. No evidence of pulmonary embolism to the lobar level. 3. Trace bilateral pleural effusions, right greater than left. Electronically signed by: Pinkie Pebbles MD 06/12/2024 11:12 PM EST RP Workstation: [id number]   DG Chest 2 View Result Date: 06/12/2024 CLINICAL DATA:  Chest pain with shortness of breath. EXAM: CHEST - 2 VIEW COMPARISON:  06/18/2022 and CT chest 05/08/2010. FINDINGS: Trachea is midline. Heart is enlarged, stable. There may be mild bibasilar streaky atelectasis. No airspace consolidation or pleural fluid. IMPRESSION: Somewhat low lung volumes with probable bibasilar streaky atelectasis. Electronically Signed   By: Newell Eke M.D.   On: 06/12/2024 14:45      .Critical Care  Performed by: Lang Norleen POUR, PA-C Authorized by: Lang Norleen POUR, PA-C   Critical care provider statement:    Critical care time (minutes):  30   Critical care was necessary to treat or prevent imminent or life-threatening deterioration of the following conditions: afib with RVR requiring diltiazem  bolus and subsequent infusion.   Critical care was time spent personally by me on the following activities:  Development of treatment plan with patient or surrogate, discussions with consultants, evaluation of patient's response to treatment, examination of patient, ordering and review of laboratory studies, ordering and review of radiographic studies, ordering and performing treatments and interventions, pulse oximetry, re-evaluation of patient's condition and review of old charts    Medications Ordered in the ED  diltiazem  (CARDIZEM ) 125 mg in dextrose 5% 125 mL (1 mg/mL) infusion (12.5 mg/hr Intravenous New Bag/Given 06/13/24 0630)  sodium chloride  flush (NS) 0.9 % injection 3 mL (3 mLs Intravenous Not Given 06/13/24 1127)  sodium chloride  flush (NS) 0.9 % injection 3 mL (3 mLs Intravenous Given 06/13/24 1127)  acetaminophen  (TYLENOL ) tablet 650 mg (has no administration in time range)    Or  acetaminophen  (TYLENOL ) suppository 650 mg (has no administration in time range)  oxyCODONE  (Oxy IR/ROXICODONE ) immediate release tablet 5 mg (has no administration in time range)  HYDROmorphone (DILAUDID) injection 0.5-1 mg (has no administration in time range)  zolpidem  (AMBIEN ) tablet 5 mg (5 mg Oral Given 06/12/24 2035)  senna-docusate (Senokot-S) tablet 1 tablet (has no administration in time range)  bisacodyl (DULCOLAX) EC tablet 5 mg (has no administration in time range)  sodium phosphate (FLEET) enema 1 enema (has no administration in time range)  ondansetron  (ZOFRAN ) tablet 4 mg (has no administration in time range)    Or  ondansetron  (ZOFRAN ) injection 4 mg (has no  administration in time range)  ipratropium (ATROVENT) nebulizer solution 0.5 mg (has no administration in time range)  hydrALAZINE  (APRESOLINE ) injection 10 mg (has no administration in time range)  apixaban  (ELIQUIS ) tablet 5 mg (5 mg Oral Given 06/13/24 1123)  ezetimibe  (ZETIA ) tablet 10 mg (10 mg Oral Given 06/13/24 1123)  nicotine  (NICODERM CQ  - dosed in mg/24 hours) patch 14 mg (14 mg Transdermal Patch Applied 06/13/24 1124)  nitroGLYCERIN  (NITROSTAT ) SL tablet 0.4 mg (has no administration in time range)  atorvastatin  (LIPITOR ) tablet 80 mg (80 mg Oral Given 06/12/24 2036)  diltiazem  (CARDIZEM ) injection 20 mg (20 mg Intravenous Given 06/12/24 1341)  sodium chloride  0.9 % bolus 1,000 mL (1,000 mLs Intravenous New Bag/Given 06/12/24 1341)  furosemide  (LASIX ) injection 80 mg (80 mg Intravenous Given 06/12/24 2036)  iohexol  (OMNIPAQUE ) 350 MG/ML injection 75 mL (75 mLs Intravenous Contrast Given 06/12/24 2306)  furosemide  (LASIX ) injection 80 mg (80 mg Intravenous Given 06/13/24 1123)                                    Medical Decision Making Amount and/or Complexity of Data Reviewed Labs: ordered. Radiology: ordered.  Risk Prescription drug management. Decision regarding hospitalization.   Initial Impression and Ddx 63 year old well-appearing male presenting for shortness of breath.  Exam notable for irregular tachycardia.  Initially appeared to be in A-fib with RVR.  EKG confirmed.  Gave 20 bolus IV diltiazem .  DDx includes A-fib with RVR, PE, dissection, ACS, electrolyte derangement, other. Patient PMH that increases complexity of ED encounter:  paroxysmal A-fib not currently taking his medications, CAD, H/O NSTEMI, CKD, hypertension, TAA   Interpretation of Diagnostics - I independent reviewed and interpreted the labs as followed: negative first troponin  - I independently visualized the following imaging with scope of interpretation limited to determining acute life  threatening conditions related to emergency care: CXR, which revealed possible bibasilar atelectasis but otherwise nonacute  -I personally reviewed and interpreted EKG which revealed A-fib with RVR  Patient Reassessment and Ultimate Disposition/Management Heart rate had improved to 120s and 130s.  He also mentioned that shortness of breath had also improved and still without chest pain.  Considered scanning for PE or dissection but suspicion at is low at this time given no chest pain reassuring vitals and symptoms improving with initiation of diltiazem .  Admitted to hospital service for A-fib with RVR with Dr. Willette.   Patient management required discussion with the following services or consulting groups:  Hospitalist Service  Complexity of Problems Addressed Acute complicated illness or Injury  Additional Data Reviewed and Analyzed Further history obtained from: Prior ED visit notes and Recent discharge summary  Patient Encounter Risk Assessment Consideration of hospitalization      Final diagnoses:  Atrial fibrillation with RVR [facility])    ED Discharge Orders          Ordered    Amb referral to AFIB Clinic        06/12/24 1335               Lang Norleen POUR, PA-C 06/13/24 1549    Simon Lavonia SAILOR, MD 06/14/24 [phone]

## 2024-06-12 NOTE — Assessment & Plan Note (Signed)
 Now converted to persistent A-fib -Likely will need rate control medications and prolonged anticoagulation therapy

## 2024-06-12 NOTE — Hospital Course (Addendum)
 Jay Ware is a 19 yeare old Male with history of paroxysmal Atrial Fibrillation, CAD, non-STEMI, CKD, HTN, TAA, .... Presenting mainly with shortness of breath, difficulty sleeping, feeling as if he is having panic attack, worsening palpitation.  Especially with laying down and with exertion. Brief transient chest pain which has resolved now.  Denies of having any recent illnesses such as cough, fever, chills, nausea, vomiting.  Also report a little more swelling of lower extremities than usual.   Reports that he has not been compliant with his medication including Eliquis  for past year or so.  Stating that medication did not make him feel well.     ED Evaluation: POA heart rate 160s Blood pressure (!) 116/91, pulse 69, temperature 97.9 F (36.6 C), source Oral, RR (!) 29, height 6' 1 (1.854 m), weight (!) 140 kg, SpO2 100% on room air Labs: CBC WBC 10.9, creatinine 1.43, calcium  8.6, glucose 110, magnesium 1.9  EKG: Irregularly irregular Chest x-ray: low lung volumes with probable bibasilar streaky atelectasis.

## 2024-06-12 NOTE — H&P (Addendum)
 History and Physical   Patient: Jay Ware                            PCP: Bobbette Coye LABOR, MD                    DOB: December 28, 1960            DOA: 06/12/2024 FMW:[id number]             DOS: 06/12/2024, 5:12 PM  Corrington, Kip A, MD  Patient coming from:   HOME  I have personally reviewed patient's medical records, in electronic medical records, including:  [place] link, and care everywhere.    Chief Complaint:   Chief Complaint  Patient presents with   Chest Pain    History of present illness:    Jay Ware is a 15 yeare old Male with history of paroxysmal atrial fibrillation, CAD, non-STEMI, CKD, HTN, TAA, .... Presenting mainly with shortness of breath, difficulty sleeping, feeling as if he is having panic attack, worsening palpitation.  Especially with laying down and with exertion. Brief transient chest pain which has resolved now.  Denies of having any recent illnesses such as cough, fever, chills, nausea, vomiting.  Also report a little more swelling of lower extremities than usual.  Reports that he has not been compliant with his medication including Eliquis  for past year or so.  Stating that medication did not make him feel well.     ED Evaluation: POA heart rate 160s Blood pressure (!) 116/91, pulse 69, temperature 97.9 F (36.6 C), source Oral, RR (!) 29, height 6' 1 (1.854 m), weight (!) 140 kg, SpO2 100% on room air Labs: CBC WBC 10.9, creatinine 1.43, calcium  8.6, glucose 110, magnesium 1.9  EKG: Irregularly irregular Chest x-ray: low lung volumes with probable bibasilar streaky atelectasis.  Patient Denies having: Fever, Chills, Cough, SOB, Chest Pain, Abd pain, N/V/D, headache, dizziness, lightheadedness,  Dysuria, Joint pain, rash, open wounds    Review of Systems: As per HPI, otherwise 10 point review of systems were negative.    ----------------------------------------------------------------------------------------------------------------------  No Known Allergies  Home MEDs:  Prior to Admission medications   Medication Sig Start Date End Date Taking? Authorizing Provider  acetaminophen  (TYLENOL ) 325 MG tablet Take 2 tablets (650 mg total) by mouth every 6 (six) hours as needed for headache or mild pain. 06/19/22   Lelon Hamilton T, PA-C  atorvastatin  (LIPITOR ) 80 MG tablet Take 80 mg by mouth at bedtime.    [provider]  clopidogrel  (PLAVIX ) 75 MG tablet Take 1 tablet (75 mg total) by mouth daily. 06/20/22   Lelon Hamilton T, PA-C  ELIQUIS  5 MG TABS tablet Take 1 tablet by mouth 2 (two) times daily. 06/13/22   [provider]  ezetimibe  (ZETIA ) 10 MG tablet Take 10 mg by mouth daily.    [provider]  lidocaine  (LIDODERM ) 5 % Place 1 patch onto the skin daily. Remove & Discard patch within 12 hours or as directed by MD Patient not taking: Reported on 06/18/2022 04/20/22   Neldon Hamp GORMAN, PA  metoprolol  succinate (TOPROL -XL) 25 MG 24 hr tablet Take 1 tablet (25 mg total) by mouth daily. 06/20/22   Lelon Hamilton T, PA-C  nicotine  (NICODERM CQ  - DOSED IN MG/24 HOURS) 14 mg/24hr patch Place 1 patch (14 mg total) onto the skin daily. 06/19/22   Lelon Hamilton T, PA-C  nitroGLYCERIN  (NITROSTAT ) 0.3  MG SL tablet Place 0.3 mg under the tongue every 5 (five) minutes as needed for chest pain. 06/11/20   [provider]    PRN MEDs: acetaminophen  **OR** acetaminophen , bisacodyl, hydrALAZINE , HYDROmorphone (DILAUDID) injection, ipratropium, nitroGLYCERIN , ondansetron  **OR** ondansetron  (ZOFRAN ) IV, oxyCODONE , senna-docusate, sodium phosphate, zolpidem   Past Medical History:  Diagnosis Date   A-fib (HCC)    CAD (coronary artery disease)    Hypertension     Past Surgical History:  Procedure Laterality Date   CARDIAC CATHETERIZATION     CORONARY STENT INTERVENTION N/A 06/18/2022    Procedure: CORONARY STENT INTERVENTION;  Surgeon: Mady Bruckner, MD;  Location: [facility];  Service: Cardiovascular;  Laterality: N/A;   LEFT HEART CATH AND CORONARY ANGIOGRAPHY N/A 06/18/2022   Procedure: LEFT HEART CATH AND CORONARY ANGIOGRAPHY;  Surgeon: Mady Bruckner, MD;  Location: [facility];  Service: Cardiovascular;  Laterality: N/A;     reports that he has quit smoking. His smoking use included cigarettes. He has never used smokeless tobacco. He reports that he does not currently use alcohol. He reports that he does not currently use drugs.   Family History  Problem Relation Age of Onset   Hypertension Mother     Physical Exam:   Vitals:   06/12/24 1332 06/12/24 1335 06/12/24 1345 06/12/24 1515  BP:  (!) 162/126 (!) 126/90 (!) 116/91  Pulse:  67 95 69  Resp:  (!) 23 (!) 21 (!) 29  Temp:      TempSrc:      SpO2:  100% 99% 100%  Weight: (!) 140 kg     Height: 6' 1 (1.854 m)      Constitutional: NAD, calm, comfortable Eyes: PERRL, lids and conjunctivae normal ENMT: Mucous membranes are moist. Posterior pharynx clear of any exudate or lesions.Normal dentition.  Neck: normal, supple, no masses, no thyromegaly Respiratory: clear to auscultation bilaterally, no wheezing, no crackles. Normal respiratory effort. No accessory muscle use.  Cardiovascular: Irregularly irregular, no murmurs / rubs / gallops. No extremity edema. 2+ pedal pulses. No carotid bruits.  Abdomen: no tenderness, no masses palpated. No hepatosplenomegaly. Bowel sounds positive.  Musculoskeletal: no clubbing / cyanosis. No joint deformity upper and lower extremities. Good ROM, no contractures. Normal muscle tone.  Neurologic: CN II-XII grossly intact. Sensation intact, DTR normal. Strength 5/5 in all 4.  Psychiatric: Normal judgment and insight. Alert and oriented x 3. Normal mood.  Skin: no rashes, lesions, ulcers. No induration        Labs on admission:    I have  personally reviewed following labs and imaging studies  CBC: Recent Labs  Lab 06/12/24 1341  WBC 10.9*  HGB 15.8  HCT 48.0  MCV 89.6  PLT 203   Basic Metabolic Panel: Recent Labs  Lab 06/12/24 1341  NA 139  K 4.5  CL 106  CO2 27  GLUCOSE 110*  BUN 18  CREATININE 1.43*  CALCIUM  8.6*  MG 1.9    Urine analysis:    Component Value Date/Time   COLORURINE YELLOW 01/03/2008 1120   APPEARANCEUR CLOUDY (A) 01/03/2008 1120   LABSPEC 1.023 01/03/2008 1120   PHURINE 5.5 01/03/2008 1120   GLUCOSEU NEGATIVE 01/03/2008 1120   HGBUR LARGE (A) 01/03/2008 1120   BILIRUBINUR NEGATIVE 01/03/2008 1120   KETONESUR NEGATIVE 01/03/2008 1120   PROTEINUR 100 (A) 01/03/2008 1120   UROBILINOGEN 1.0 01/03/2008 1120   NITRITE NEGATIVE 01/03/2008 1120   LEUKOCYTESUR MODERATE (A) 01/03/2008 1120    Last A1C:  Lab Results  Component Value Date   HGBA1C 5.0 06/18/2022     Radiologic Exams on Admission:   DG Chest 2 View Result Date: 06/12/2024 CLINICAL DATA:  Chest pain with shortness of breath. EXAM: CHEST - 2 VIEW COMPARISON:  06/18/2022 and CT chest 05/08/2010. FINDINGS: Trachea is midline. Heart is enlarged, stable. There may be mild bibasilar streaky atelectasis. No airspace consolidation or pleural fluid. IMPRESSION: Somewhat low lung volumes with probable bibasilar streaky atelectasis. Electronically Signed   By: Newell Eke M.D.   On: 06/12/2024 14:45    EKG:   Independently reviewed.  Orders placed or performed during the hospital encounter of 06/12/24   EKG 12-Lead   EKG 12-Lead   EKG 12-Lead   ---------------------------------------------------------------------------------------------------------------------------------------    Assessment / Plan:   Principal Problem:   A-fib (HCC) Active Problems:   CAD (coronary artery disease)   Paroxysmal atrial fibrillation (HCC)   CKD (chronic kidney disease) stage 2, GFR 60-89 ml/min   OSA (obstructive sleep  apnea)   Thoracic ascending aortic aneurysm   Essential hypertension   Hyperlipidemia LDL goal <70   Assessment and Plan: * A-fib (HCC) - A-fib with RVR - Per EDP on admission heart rate around 160s - Patient has been started on Cardizem  drip-heart rate has improved to low 100s - Home medication reviewed-per patient noncompliant has not had his medication including metoprolol  and Eliquis  over a year now -Starting both Eliquis  and metoprolol  XL at 25 mg daily -Titrating patient off Cardizem  drip -Obtaining 2D echocardiogram, recycle cardiac enzymes -Cardiology consulted-appreciate further evaluation recommendations  CAD (coronary artery disease) -History of  CAD w STEMI, S/P PCI in 2000 and 2023 - Last echo in 2023, EF 60-65%, -Noncompliant with current medications -Home medications reviewed, restarting beta-blocker, statins, Eliquis  -Currently denying any chest pain, initial troponin negative -Obtaining 2D echocardiogram  CKD (chronic kidney disease) stage 2, GFR 60-89 ml/min  Lab Results  Component Value Date   CREATININE 1.43 (H) 06/12/2024   CREATININE 1.49 (H) 06/22/2022   CREATININE 1.36 (H) 06/19/2022  BUN/creatinine 18, creatinine elevated from baseline of 1.36 GFR 55 - Avoiding hypotension, avoiding nephrotoxins -   Paroxysmal atrial fibrillation (HCC) Now converted to persistent A-fib -Likely will need rate control medications and prolonged anticoagulation therapy  OSA (obstructive sleep apnea) History of obstructive sleep apnea, patient need sleep study as an outpatient-would likely will benefit from CPAP    Hyperlipidemia LDL goal <70 Obtaining a lipid panel - Patient has been noncompliant with her statin medication - Restarting Lipitor  at 40 mg, if intolerable need a trial of alternative statins - LDL goal less than 70   Essential hypertension POA: Blood pressure was elevated as high as 162/126 -Blood pressure is much improved on Cardizem  drip,  currently 116/91 -Initiating home medication of metoprolol , monitoring BP closely, titrating meds for better BP control before discharge  Thoracic ascending aortic aneurysm History of thoracic aortic aneurysm Last CTA in our system: The ascending thoracic aorta shows mild aneurysmaldilatation with maximal diameter of 4.4 cm.  - Patient would likely benefit from another CTA angiogram to evaluate aneurysm -Unfortunately elevated creatinine at baseline--- will hydrate, monitor kidney function- anticipate CT angiogram before discharge      Consults called: Cardiology -------------------------------------------------------------------------------------------------------------------------------------------- DVT prophylaxis:  SCDs Start: 06/12/24 1522 apixaban  (ELIQUIS ) tablet 5 mg   Code Status:   Code Status: Full Code   Admission status: Patient will be admitted as Observation, with a greater than 2 midnight length of stay. Level of care:  Telemetry   Family Communication:  none at bedside  (The above findings and plan of care has been discussed with patient in detail, the patient expressed understanding and agreement of above plan)  --------------------------------------------------------------------------------------------------------------------------------------- Disposition Plan:  Anticipated 1-2 days Status is: Observation The patient remains OBS appropriate and will d/c before 2 midnights.     ---------------------------------------------------------------------------------------------------------------------------------------  Time spent:  75  Min.  Was spent seeing and evaluating the patient, reviewing all medical records, drawn plan of care.  SIGNED: Adriana DELENA Grams, MD, FHM. FAAFP.  - [facility], Pager  (Please use [email or web address] to page/ or secure chat through epic) If 7PM-7AM, please contact night-coverage [email or web address],  06/12/2024, 5:12 PM

## 2024-06-12 NOTE — Assessment & Plan Note (Signed)
 History of obstructive sleep apnea, patient need sleep study as an outpatient-would likely will benefit from CPAP

## 2024-06-12 NOTE — Assessment & Plan Note (Signed)
 History of thoracic aortic aneurysm Last CTA in our system: The ascending thoracic aorta shows mild aneurysmaldilatation with maximal diameter of 4.4 cm.  - Patient would likely benefit from another CTA angiogram to evaluate aneurysm -Unfortunately elevated creatinine at baseline--- will hydrate, monitor kidney function- anticipate CT angiogram before discharge

## 2024-06-12 NOTE — Assessment & Plan Note (Signed)
 POA: Blood pressure was elevated as high as 162/126 -Blood pressure is much improved on Cardizem  drip, currently 116/91 -Initiating home medication of metoprolol , monitoring BP closely, titrating meds for better BP control before discharge

## 2024-06-12 NOTE — ED Triage Notes (Signed)
 Pt states that he is having centralized chest pain with sob for a couple of days.  Pt has been feeling anxious and states that it feels like panic more than anything

## 2024-06-12 NOTE — Progress Notes (Signed)
° °  Brief Progress Note   _____________________________________________________________________________________________________________  Patient Name: Jay Ware Patient DOB: 1961/05/06 Date: @TODAY @      Data: Reviewed vital signs, labs, and notes.    Action: No action required at this time.     Response:  Bed assigned.  _____________________________________________________________________________________________________________  [facility] RN [employer] [place] Oswin Griffith Please contact us  directly via secure chat (search for [facility]) or by calling us  at [phone] [facility]).

## 2024-06-12 NOTE — Assessment & Plan Note (Addendum)
-  History of  CAD w STEMI, S/P PCI in 2000 and 2023 - Last echo in 2023, EF 60-65%, -Noncompliant with current medications -Home medications reviewed, restarting beta-blocker, statins, Eliquis  -Currently denying any chest pain, initial troponin negative -Obtaining 2D echocardiogram

## 2024-06-12 NOTE — Plan of Care (Signed)

## 2024-06-12 NOTE — ED Provider Notes (Signed)
 Care assumed from Norleen Essex, PA-C at shift change. Please see their note for further information.  Briefly: Patient with hx afib not taking his Eliquis  or metoprolol  presents with 2 days of shortness of breath.   Patient started on Cardizem  drip with improvement, hospitalist consulted for admission. Previous provider spoke with hospitalist Dr. Willette who accepts patient for admission, however requests cardiology consult.   Discussed patient with Trish with cardiology, they will see the patient in consultation.   Malesha Suliman A, PA-C 06/12/24 1537    Kammerer, Megan L, DO 06/13/24 1512

## 2024-06-12 NOTE — Assessment & Plan Note (Addendum)
-   A-fib with RVR - Per EDP on admission heart rate around 160s - Patient has been started on Cardizem  drip-heart rate has improved to low 100s - Home medication reviewed-per patient noncompliant has not had his medication including metoprolol  and Eliquis  over a year now -Starting both Eliquis  and metoprolol  XL at 25 mg daily -Titrating patient off Cardizem  drip -Obtaining 2D echocardiogram, recycle cardiac enzymes -Cardiology consulted-appreciate further evaluation recommendations

## 2024-06-12 NOTE — TOC CM/SW Note (Signed)
 TOC consult received for d/c planning needs. Per notes, patient has not been taking his regular home medications. Follow-up to be completed with patient as appropriate.   Merilee Batty, MSN, RN Case Management [phone]

## 2024-06-12 NOTE — Consult Note (Addendum)
 Cardiology Consultation   Patient ID: Jay Ware MRN: [id number]; DOB: 04-29-1961  Admit date: 06/12/2024 Date of Consult: 06/12/2024  PCP:  Jay Ware LABOR, MD   Jay Ware Providers Cardiologist:  None      Patient Profile: Jay Ware is a 63 y.o. male with a hx of CAD c/b NSTEMI s/p PCI (2000 and 2023), PAF, PAD s/p Fem artery stent (2011), prior tobacco use w/ active vaping, morbid obesity, OSA (not on CPAP), CKD 2, TAA, HTN, HLD, and ischemic cardiomyopathy ( EF in 2023 60-65%)  who is being seen 06/12/2024 for the evaluation of atrial fibrillation at the request of Jay Grams MD.  History of Present Illness: Jay Ware was seen by [facility] in 2023 in the setting of NSTEMI. Troponin < 3000. He underwent a LHC and underwent PCI to the OM. Other findings: mild non-obstructive CAD involving the LAD and RCA. Patent RPDA stent. Echocardiogram that admission showed LVEF 60-65% with no RWMA. G1 DD. Mildly enlarged RV with mildly reduced function. Aortic dilatation of 48 mm. He was discharged on DAPT, eliquis , lipitor , zetia , and Toprol  XL 25 mg. Patient did not follow up with [facility] outpatient.   Reported to the ED for chest pain and shortness of breath. Reported stopping home medications.  BP: 126/90 ECG: Atrial Fibrillation, LAD VR 142 CXR showed low lung volumes with atelectasis  Pertinent lab work: Cr 1.43 [around baseline] Troponin Negative BNP and TSH pending   Patient was restarted on eliquis  and received IV fluids. He is currently on IV dilt for rate control.   On interview, patient shared the last 3 days he started noticing panic attacks. Him and wife stated he felt chaotic. Most pressuring symptom is inability to sleep due to breathing. Shares as soon as he falls asleep he wakes up gasping. Reports some shortness of breath on exertion but not notable. Denied anginal chest pain.  Denied recent illness or diet change.  Reports he has  not seen a cardiologist since 2023. Stopped all of his medications at home. Did take a dose of metoprolol  and possibly eliquis  last night prior to coming into ED.    Past Medical History:  Diagnosis Date   A-fib (HCC)    CAD (coronary artery disease)    Hypertension     Past Surgical History:  Procedure Laterality Date   CARDIAC CATHETERIZATION     CORONARY STENT INTERVENTION N/A 06/18/2022   Procedure: CORONARY STENT INTERVENTION;  Surgeon: Jay Bruckner, MD;  Location: [facility];  Service: Cardiovascular;  Laterality: N/A;   LEFT HEART CATH AND CORONARY ANGIOGRAPHY N/A 06/18/2022   Procedure: LEFT HEART CATH AND CORONARY ANGIOGRAPHY;  Surgeon: Jay Bruckner, MD;  Location: [facility];  Service: Cardiovascular;  Laterality: N/A;       Scheduled Meds:  apixaban   5 mg Oral BID   atorvastatin   80 mg Oral QHS   ezetimibe   10 mg Oral Daily   metoprolol  succinate  25 mg Oral Daily   nicotine   14 mg Transdermal Daily   sodium chloride  flush  3 mL Intravenous Q12H   sodium chloride  flush  3 mL Intravenous Q12H   Continuous Infusions:  sodium chloride      diltiazem  (CARDIZEM ) infusion 5 mg/hr (06/12/24 1420)   PRN Meds: acetaminophen  **OR** acetaminophen , bisacodyl, hydrALAZINE , HYDROmorphone (DILAUDID) injection, ipratropium, nitroGLYCERIN , ondansetron  **OR** ondansetron  (ZOFRAN ) IV, oxyCODONE , senna-docusate, sodium phosphate, zolpidem   Allergies:   No Known Allergies  Social History:   Social  History   Socioeconomic History   Marital status: Married    Spouse name: Not on file   Number of children: Not on file   Years of education: Not on file   Highest education level: Not on file  Occupational History   Not on file  Tobacco Use   Smoking status: Former    Types: Cigarettes   Smokeless tobacco: Never  Vaping Use   Vaping status: Some Days   Substances: Nicotine   Substance and Sexual Activity   Alcohol use: Not Currently   Drug use: Not  Currently   Sexual activity: Not on file  Other Topics Concern   Not on file  Social History Narrative   Not on file   Social Drivers of Health   Financial Resource Strain: Low Risk (06/14/2021)   Received from [facility]   Overall Financial Resource Strain (CARDIA)    Difficulty of Paying Living Expenses: Not hard at all  Food Insecurity: No Food Insecurity (11/17/2021)   Received from [facility]   Hunger Vital Sign    Within the past 12 months, you worried that your food would run out before you got the money to buy more.: Never true    Within the past 12 months, the food you bought just didn't last and you didn't have money to get more.: Never true  Transportation Needs: No Transportation Needs (06/14/2021)   Received from [facility] - Transportation    Lack of Transportation (Medical): No    Lack of Transportation (Non-Medical): No  Physical Activity: Insufficiently Active (06/14/2021)   Received from [facility]   Exercise Vital Sign    On average, how many days per week do you engage in moderate to strenuous exercise (like a brisk walk)?: 2 days    On average, how many minutes do you engage in exercise at this level?: 30 min  Stress: No Stress Concern Present (06/14/2021)   Received from [facility] - Occupational Stress Questionnaire    Feeling of Stress : Not at all  Social Connections: Unknown (10/31/2021)   Received from [facility]   Social Network    Social Network: Not on file  Intimate Partner Violence: Unknown (10/05/2021)   Received from [facility]   HITS    Physically Hurt: Not on file    Insult or Talk Down To: Not on file    Threaten Physical Harm: Not on file    Scream or Curse: Not on file    Family History:   Family History  Problem Relation Age of Onset   Hypertension Mother      ROS:  Please see the history of present illness.  All other ROS reviewed and negative.      Physical Exam/Data: Vitals:   06/12/24 1332 06/12/24 1335 06/12/24 1345 06/12/24 1515  BP:  (!) 162/126 (!) 126/90 (!) 116/91  Pulse:  67 95 69  Resp:  (!) 23 (!) 21 (!) 29  Temp:      TempSrc:      SpO2:  100% 99% 100%  Weight: (!) 140 kg     Height: 6' 1 (1.854 m)      No intake or output data in the 24 hours ending 06/12/24 1535    06/12/2024    1:32 PM 06/19/2022    4:07 AM 06/18/2022    7:25 AM  Last 3 Weights  Weight (lbs) 308 lb 10.3 oz 308 lb 13.8  oz 305 lb  Weight (kg) 140 kg 140.1 kg 138.347 kg     Body mass index is 40.72 kg/m.  General:  Pleasant obese gentlemen in no acute distress HEENT: normal Neck: JVD hard to assess during interview Vascular: Distal pulses 2+ bilaterally Cardiac:  normal S1, S2; RRR; no murmur Lungs:  clear to auscultation bilaterally, no wheezing, rhonchi or rales  Abd: soft, nontender, distended Ext: trace pitting edema Musculoskeletal:  No deformities, BUE and BLE strength normal and equal Skin: warm and dry  Psych:  Normal affect   EKG:  The EKG was personally reviewed and demonstrates:  See HPI Telemetry:  Telemetry was personally reviewed and demonstrates:  AF reached HR 155, most recent HR ~115  Relevant CV Studies:  Echocardiogram 2023 IMPRESSIONS     1. Left ventricular ejection fraction, by estimation, is 60 to 65%. The  left ventricle has normal function. The left ventricle has no regional  wall motion abnormalities. Left ventricular diastolic parameters are  consistent with Grade I diastolic  dysfunction (impaired relaxation).   2. Right ventricular systolic function is mildly reduced. The right  ventricular size is mildly enlarged.   3. The mitral valve is grossly normal. No evidence of mitral valve  regurgitation. No evidence of mitral stenosis.   4. The aortic valve is grossly normal. Aortic valve regurgitation is not  visualized. No aortic stenosis is present.   5. Aortic dilatation noted. There is moderate  dilatation of the aortic  root, measuring 48 mm. There is mild dilatation of the ascending aorta,  measuring 46 mm.   6. The inferior vena cava is dilated in size with >50% respiratory  variability, suggesting right atrial pressure of 8 mmHg.   LHC 2023 agnostic Dominance: Right  Intervention   Laboratory Data: High Sensitivity Troponin:   Recent Labs  Lab 06/12/24 1341  TROPONINIHS 15     Chemistry Recent Labs  Lab 06/12/24 1341  NA 139  K 4.5  CL 106  CO2 27  GLUCOSE 110*  BUN 18  CREATININE 1.43*  CALCIUM  8.6*  MG 1.9  GFRNONAA 55*  ANIONGAP 6    Hematology Recent Labs  Lab 06/12/24 1341  WBC 10.9*  RBC 5.36  HGB 15.8  HCT 48.0  MCV 89.6  MCH 29.5  MCHC 32.9  RDW 13.3  PLT 203    Radiology/Studies:  DG Chest 2 View Result Date: 06/12/2024 CLINICAL DATA:  Chest pain with shortness of breath. EXAM: CHEST - 2 VIEW COMPARISON:  06/18/2022 and CT chest 05/08/2010. FINDINGS: Trachea is midline. Heart is enlarged, stable. There may be mild bibasilar streaky atelectasis. No airspace consolidation or pleural fluid. IMPRESSION: Somewhat low lung volumes with probable bibasilar streaky atelectasis. Electronically Signed   By: Newell Eke M.D.   On: 06/12/2024 14:45     Assessment and Plan:  Atrial Fibrillation with episode of RVR Currently in AF RVR HR ~115 Chad Vas score 2 TSH pending Echocardiogram pending   Patient reported not taking any medications prior to admission. Did take a dose of metoprolol  and possibly eliquis , though may have been out of date. As he is not properly anticoagulated would not pursue rhythm control at this time, would like to avoid amiodarone if able to prevent chemical conversion.  Will tentatively plan for TEE/DCCV Friday. Keep K> 4 and Mag >2  Continue IV dilt for now. Will discuss with MD.  Continue eliquis  5 mg BID  Acute on chronic HFmrEF [ EF 40-45% in 2021, 60-65%  in 2023] Admission in 2021 patient noted to be in  AF RVR resulting in EF reduction 40-45%.  Patient does appear volume up on exam BNP pending Will follow back up on echo.   Give IV lasix  80 mg Will hold on BB for now.  OSA not on CPAP Patient reporting significant shortness of breath at night with daytime sleepiness. Wife describes him as waking up gasping. On chart review patient has reported daytime fatigue and poor sleep quality for some time. Suspect a component of volume overloaded and untreated OSA. Advised patient to start using CPAP, would recommend use tonight if able.  Will assess RV on echocardiogram, mild dysfunction on echo in 2023.   CAD s/p PCI in 2000 & 2023 Patient reported no anginal chest pain. Though reported he did not have chest pain with prior cardiac events. Troponin negative.  Will follow up on echo.   Defer antiplatelet with chronic anticoagulation Hold on BB for now   Hypertension BP: 116/91 Medications as above  Hyperlipidemia Lipid panel for the am  Patient will need repeat lipid panel and LFTs in 6-8 weeks.  Increase lipitor  to 80 mg Continue zetia  10 mg  TAA Will follow up on echocardiogram.   PAD s/ fem angioplasty in 2011 Medications as above.   Risk Assessment/Risk Scores:     CHA2DS2-VASc Score = 2   This indicates a 2.2% annual risk of stroke. The patient's score is based upon: CHF History: 0 HTN History: 1 Diabetes History: 0 Stroke History: 0 Vascular Disease History: 1 Age Score: 0 Gender Score: 0        For questions or updates, please contact [place] [facility] Please consult [email or web address] for contact info under      Signed, Leontine LOISE Salen, PA-C  06/12/2024 3:35 PM  Patient seen and examined, note reviewed with the signed Advanced Practice Provider. I personally reviewed laboratory data, imaging studies and relevant notes. I independently examined the patient and formulated the important aspects of the plan. I have personally discussed the plan with the  patient and/or family. Comments or changes to the note/plan are indicated below.  HPI: This is a 63 year old male with history of CAD and NSTEMI s/p PCI in 2000 and 2023, paroxysmal atrial fibrillation, PAD s/p femoral artery stent in 2011, prior tobacco use and active vaping, morbid obesity, OSA not on CPAP, CKD, thoracic aortic aneurysm, hypertension, hyperlipidemia, ischemic cardiomyopathy, medication noncompliance who presents today with shortness of breath and anxiety.  States that he feels very anxious and short of breath when laying down and because of this he has been unable to get any sleep for the past several nights.  He has not noticed any weight gain but has noted ankle edema.  No significant chest pain.  My Exam:  Physical Exam Vitals and nursing note reviewed.  Constitutional:      General: He is not in acute distress.    Appearance: Normal appearance. He is obese. He is not ill-appearing.  HENT:     Head: Normocephalic and atraumatic.  Eyes:     Conjunctiva/sclera: Conjunctivae normal.  Neck:     Vascular: Hepatojugular reflux and JVD present.     Comments: HJR up to angle of mandible Cardiovascular:     Rate and Rhythm: Normal rate and regular rhythm.  Pulmonary:     Effort: Pulmonary effort is normal.  Musculoskeletal:     Comments: 1+ lower extremity edema  Skin:    Coloration: Skin is not jaundiced or  pale.  Neurological:     Mental Status: He is alert.      Telemetry: Not attached- Personally reviewed EKG: Atrial fibrillation with RVR- Personally reviewed   Assessment & Plan:  Atrial fibrillation with RVR-has been noncompliant with his anticoagulation and other meds.  Currently on a Cardizem  drip however I suspect that he has a mild heart failure exacerbation.  Continue Cardizem  drip for now pending echocardiogram evaluation.  If patient remains in A-fib then we will likely pursue TEE with cardioversion on Friday. Suspected mild acute on chronic heart  failure exacerbation, likely tachycardia induced-agree with Lasix  80 mg IV.  Echocardiogram.  Monitor daily weights and intake/output OSA not on CPAP CAD s/p PCI in 2000 and 2023 to OM1 Hypertension Hyperlipidemia Thoracic aortic aneurysm Medication noncompliance strongly advised to adhere to his medication regimen PAD s/p femoral angioplasty in 2011   Signed, Keanan Melander, DO [place]  [facility]  06/12/2024 5:21 PM

## 2024-06-12 NOTE — Assessment & Plan Note (Signed)
 Obtaining a lipid panel - Patient has been noncompliant with her statin medication - Restarting Lipitor  at 40 mg, if intolerable need a trial of alternative statins - LDL goal less than 70

## 2024-06-12 NOTE — Assessment & Plan Note (Signed)
°  Lab Results  Component Value Date   CREATININE 1.43 (H) 06/12/2024   CREATININE 1.49 (H) 06/22/2022   CREATININE 1.36 (H) 06/19/2022  BUN/creatinine 18, creatinine elevated from baseline of 1.36 GFR 55 - Avoiding hypotension, avoiding nephrotoxins -

## 2024-06-13 ENCOUNTER — Encounter (HOSPITAL_COMMUNITY): Payer: Self-pay | Admitting: Family Medicine

## 2024-06-13 ENCOUNTER — Observation Stay (HOSPITAL_COMMUNITY)

## 2024-06-13 ENCOUNTER — Other Ambulatory Visit (HOSPITAL_COMMUNITY): Payer: Self-pay

## 2024-06-13 ENCOUNTER — Telehealth (HOSPITAL_COMMUNITY): Payer: Self-pay

## 2024-06-13 DIAGNOSIS — I7121 Aneurysm of the ascending aorta, without rupture: Secondary | ICD-10-CM | POA: Diagnosis present

## 2024-06-13 DIAGNOSIS — I130 Hypertensive heart and chronic kidney disease with heart failure and stage 1 through stage 4 chronic kidney disease, or unspecified chronic kidney disease: Secondary | ICD-10-CM | POA: Diagnosis present

## 2024-06-13 DIAGNOSIS — Z8249 Family history of ischemic heart disease and other diseases of the circulatory system: Secondary | ICD-10-CM | POA: Diagnosis not present

## 2024-06-13 DIAGNOSIS — E785 Hyperlipidemia, unspecified: Secondary | ICD-10-CM | POA: Diagnosis present

## 2024-06-13 DIAGNOSIS — I4891 Unspecified atrial fibrillation: Secondary | ICD-10-CM | POA: Diagnosis not present

## 2024-06-13 DIAGNOSIS — J9811 Atelectasis: Secondary | ICD-10-CM | POA: Diagnosis present

## 2024-06-13 DIAGNOSIS — G4700 Insomnia, unspecified: Secondary | ICD-10-CM | POA: Diagnosis present

## 2024-06-13 DIAGNOSIS — G4733 Obstructive sleep apnea (adult) (pediatric): Secondary | ICD-10-CM | POA: Diagnosis present

## 2024-06-13 DIAGNOSIS — Z6841 Body Mass Index (BMI) 40.0 and over, adult: Secondary | ICD-10-CM | POA: Diagnosis not present

## 2024-06-13 DIAGNOSIS — I3139 Other pericardial effusion (noninflammatory): Secondary | ICD-10-CM | POA: Diagnosis not present

## 2024-06-13 DIAGNOSIS — I4819 Other persistent atrial fibrillation: Secondary | ICD-10-CM | POA: Diagnosis present

## 2024-06-13 DIAGNOSIS — Z7902 Long term (current) use of antithrombotics/antiplatelets: Secondary | ICD-10-CM | POA: Diagnosis not present

## 2024-06-13 DIAGNOSIS — I252 Old myocardial infarction: Secondary | ICD-10-CM | POA: Diagnosis not present

## 2024-06-13 DIAGNOSIS — I482 Chronic atrial fibrillation, unspecified: Secondary | ICD-10-CM

## 2024-06-13 DIAGNOSIS — I255 Ischemic cardiomyopathy: Secondary | ICD-10-CM | POA: Diagnosis present

## 2024-06-13 DIAGNOSIS — I493 Ventricular premature depolarization: Secondary | ICD-10-CM | POA: Diagnosis present

## 2024-06-13 DIAGNOSIS — Z955 Presence of coronary angioplasty implant and graft: Secondary | ICD-10-CM | POA: Diagnosis not present

## 2024-06-13 DIAGNOSIS — I2510 Atherosclerotic heart disease of native coronary artery without angina pectoris: Secondary | ICD-10-CM | POA: Diagnosis present

## 2024-06-13 DIAGNOSIS — G4730 Sleep apnea, unspecified: Secondary | ICD-10-CM | POA: Diagnosis not present

## 2024-06-13 DIAGNOSIS — I5023 Acute on chronic systolic (congestive) heart failure: Secondary | ICD-10-CM | POA: Diagnosis present

## 2024-06-13 DIAGNOSIS — I10 Essential (primary) hypertension: Secondary | ICD-10-CM | POA: Diagnosis not present

## 2024-06-13 DIAGNOSIS — F17290 Nicotine dependence, other tobacco product, uncomplicated: Secondary | ICD-10-CM | POA: Diagnosis present

## 2024-06-13 DIAGNOSIS — F410 Panic disorder [episodic paroxysmal anxiety] without agoraphobia: Secondary | ICD-10-CM | POA: Diagnosis present

## 2024-06-13 DIAGNOSIS — Z7901 Long term (current) use of anticoagulants: Secondary | ICD-10-CM | POA: Diagnosis not present

## 2024-06-13 DIAGNOSIS — Z91148 Patient's other noncompliance with medication regimen for other reason: Secondary | ICD-10-CM | POA: Diagnosis not present

## 2024-06-13 DIAGNOSIS — N182 Chronic kidney disease, stage 2 (mild): Secondary | ICD-10-CM | POA: Diagnosis present

## 2024-06-13 DIAGNOSIS — Z79899 Other long term (current) drug therapy: Secondary | ICD-10-CM | POA: Diagnosis not present

## 2024-06-13 LAB — COMPREHENSIVE METABOLIC PANEL WITH GFR
ALT: 67 U/L — ABNORMAL HIGH (ref 0–44)
AST: 34 U/L (ref 15–41)
Albumin: 3.3 g/dL — ABNORMAL LOW (ref 3.5–5.0)
Alkaline Phosphatase: 62 U/L (ref 38–126)
Anion gap: 9 (ref 5–15)
BUN: 16 mg/dL (ref 8–23)
CO2: 25 mmol/L (ref 22–32)
Calcium: 8.4 mg/dL — ABNORMAL LOW (ref 8.9–10.3)
Chloride: 104 mmol/L (ref 98–111)
Creatinine, Ser: 1.45 mg/dL — ABNORMAL HIGH (ref 0.61–1.24)
GFR, Estimated: 54 mL/min — ABNORMAL LOW (ref >60)
Glucose, Bld: 92 mg/dL (ref 70–99)
Potassium: 3.9 mmol/L (ref 3.5–5.1)
Sodium: 138 mmol/L (ref 135–145)
Total Bilirubin: 1.7 mg/dL — ABNORMAL HIGH (ref 0.0–1.2)
Total Protein: 6.3 g/dL — ABNORMAL LOW (ref 6.5–8.1)

## 2024-06-13 LAB — ECHOCARDIOGRAM COMPLETE
Height: 73 in
S' Lateral: 5.3 cm
Single Plane A4C EF: 28.4 %
Weight: 4758.4 [oz_av]

## 2024-06-13 LAB — LIPID PANEL
Cholesterol: 120 mg/dL (ref 0–200)
HDL: 38 mg/dL — ABNORMAL LOW (ref >40)
LDL Cholesterol: 65 mg/dL (ref 0–99)
Total CHOL/HDL Ratio: 3.2 ratio
Triglycerides: 86 mg/dL (ref <150)
VLDL: 17 mg/dL (ref 0–40)

## 2024-06-13 LAB — GLUCOSE, CAPILLARY: Glucose-Capillary: 106 mg/dL — ABNORMAL HIGH (ref 70–99)

## 2024-06-13 MED ORDER — SODIUM CHLORIDE 0.9 % IV SOLN: INTRAVENOUS | Status: DC

## 2024-06-13 MED ORDER — FUROSEMIDE 10 MG/ML IJ SOLN
80.0000 mg | Freq: Once | INTRAMUSCULAR | Status: AC
  Administered 2024-06-13: 80 mg via INTRAVENOUS
  Filled 2024-06-13: qty 8

## 2024-06-13 NOTE — Progress Notes (Addendum)
 Progress Note  Patient Name: Jay Ware Date of Encounter: 06/13/2024 [place] Cardiologist: [name], MD   Interval Summary   Resting comfortably in bed  Reports good urine output Says most of his issues last night were panic attacks Reviewed plan for possible TEE/DCCV tomorrow if Jay Ware remains in A-Fib  Vital Signs Vitals:   06/13/24 0200 06/13/24 0215 06/13/24 0319 06/13/24 0734  BP:   113/76 107/70  Pulse: (!) 50 (!) 56 (!) 56 84  Resp:   20 (!) 23  Temp:   99.3 F (37.4 C) 98.4 F (36.9 C)  TempSrc:   Oral Oral  SpO2: (!) 83% 100% 100% 99%  Weight:   134.9 kg   Height:        Intake/Output Summary (Last 24 hours) at 06/13/2024 0815 Last data filed at 06/13/2024 0500 Gross per 24 hour  Intake 234.66 ml  Output 2200 ml  Net -1965.34 ml      06/13/2024    3:19 AM 06/12/2024    1:32 PM 06/19/2022    4:07 AM  Last 3 Weights  Weight (lbs) 297 lb 6.4 oz 308 lb 10.3 oz 308 lb 13.8 oz  Weight (kg) 134.9 kg 140 kg 140.1 kg     Telemetry/ECG  Atrial fibrillation, HR 90s to 100s, PVCs- Personally Reviewed  Physical Exam  GEN: No acute distress.   Neck: No JVD Cardiac: iRRR, no murmurs, rubs, or gallops.  Respiratory: Clear to auscultation bilaterally. GI: Soft, nontender, non-distended  MS: 1+ LE edema  Assessment & Plan   Jay Ware is a 63 y.o. male with a hx of CAD c/b NSTEMI s/p PCI (2000 and 2023), PAF, PAD s/p Fem artery stent (2011), prior tobacco use w/ active vaping, morbid obesity, OSA (not on CPAP), CKD 2, TAA, HTN, HLD, and ischemic cardiomyopathy (EF in 2023 60-65%)  who is being seen for atrial fibrillation   Paroxysmal atrial fibrillation with RVR Known history of paroxysmal atrial fibrillation Patient reports noncompliance with medications prior to admission TSH normal, electrolytes normal Currently in A-fib with HR 90s to 100s Currently on IV diltiazem  running at 12.5 mg/hr Restarted on Eliquis  5 mg BID Pending  updated echocardiogram Plan for TEE/DCCV if remains in A-fib on Friday 12/12 NPO at midnight  Informed Consent   Shared Decision Making/Informed Consent   The risks [stroke, cardiac arrhythmias rarely resulting in the need for a temporary or permanent pacemaker, skin irritation or burns, esophageal damage, perforation (1:10,000 risk), bleeding, pharyngeal hematoma as well as other potential complications associated with conscious sedation including aspiration, arrhythmia, respiratory failure and death], benefits (treatment guidance, restoration of normal sinus rhythm, diagnostic support) and alternatives of a transesophageal echocardiogram guided cardioversion were discussed in detail with Jay Ware and Jay Ware is willing to proceed.     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 297 lb  Patient reports good urine output and improvement in symptoms  Currently on IV diltiazem  as above Continue to monitor daily weights, strict I's and O's, daily BMPs Pending updated echocardiogram Consider additional dose of IV Lasix  this morning   CAD s/p PCI to RPDA 2000, to OM1 2023  Hyperlipidemia PAD s/p femoral angioplasty in 2011 Patient denied any chest pain 06/13/2024: ALT 67; HDL 38; LDL Cholesterol 65  Continue Lipitor  80 mg Continue Zetia  10 mg Follow-up echocardiogram No long-term antiplatelet therapy with Eliquis  use  Thoracic aortic aneurysm CTA chest this admission showed: Stable 4.4 cm ascending thoracic aortic aneurysm, no evidence of dissection Continue to monitor annually   For questions or updates, please contact [facility] Please consult [email or web address] for contact info under     Signed, Waddell DELENA Donath, PA-C   Patient seen and examined, note reviewed with the signed Advanced Practice Provider. I personally reviewed laboratory data, imaging studies and relevant notes. I independently examined the patient and formulated the important aspects of the plan. I have personally discussed the plan with the patient and/or family. Comments or changes to the note/plan are indicated below.  HPI: Jay Ware is a 63 y.o. male with a hx of CAD c/b NSTEMI s/p PCI (2000 and 2023), PAF, PAD s/p Fem artery stent (2011), prior tobacco use w/ active vaping, morbid obesity, OSA (not on CPAP), CKD 2, TAA, HTN, HLD, and ischemic cardiomyopathy ( EF in 2023 60-65%)  who is being seen 06/12/2024 for the evaluation of atrial fibrillation at the request of Adriana Grams MD.   Feeling better today after diuresing overnight and was able to get some sleep. Jay Ware did eat this morning.   My Exam:  Physical Exam Vitals and nursing note reviewed.  Constitutional:      Appearance: Normal appearance.  HENT:     Head: Normocephalic and atraumatic.  Eyes:     Conjunctiva/sclera: Conjunctivae normal.  Neck:     Comments: Mild JVD to about 2 cm above clavicle Cardiovascular:     Rate and Rhythm: Tachycardia present. Rhythm irregular.  Pulmonary:     Effort: Pulmonary effort is normal.     Breath sounds: Normal breath sounds.  Skin:    Coloration: Skin is not jaundiced or pale.  Neurological:     Mental Status: Jay Ware is alert.      Telemetry: AF with RVR - Personally reviewed EKG: AF with RVR - Personally reviewed  Assessment & Plan:   Atrial fibrillation with RVR-has been noncompliant with his home anticoagulation and other meds.  Currently on a Cardizem  drip with improved rates and improving volume status. TEE with cardioversion tomorrow, unable to do today as Jay Ware ate already and only had one dose of Eliquis . Suspected mild acute on chronic heart failure exacerbation, likely  tachycardia induced-volume status significantly improved. Echocardiogram. Will give a dose of lasix  80 IV this AM and likely PO starting tomorrow.  OSA not on CPAP CAD s/p PCI in 2000 and 2023 to OM1 Hypertension Hyperlipidemia Thoracic aortic aneurysm Medication noncompliance strongly advised to adhere to his medication regimen PAD s/p femoral angioplasty in 2011    Signed, Lamoyne Palencia, DO [place]  [facility]  06/13/2024 9:52 AM

## 2024-06-13 NOTE — Evaluation (Addendum)
 Occupational Therapy Evaluation Patient Details Name: Jay Ware MRN: [id number] DOB: 25-Aug-1960 Today's Date: 06/13/2024   History of Present Illness   Pt is a 63 y.o. M presenting to [facility] on 06/12/24 with SOB and chest pain. Pt admitted with A-fib with RVR. PMH is significant for A-fib, CAD, NSTEMI, CKD, HTN, and TAA.     Clinical Impressions Pt is at Mod I level with ADLs and ADL with mildly impaired balance and activity tolerance. PTA pt lives with his wife and was Ind with ADLs, ADL mobility, was driving and required no ADs or DME. Pt demos no SOB, LOB, difficulty or incoordination during functional mobility and activity; HR at rest 111-119, going up to 126 during in room ADL and ADL activity/ All education completed and no further acute OT services are indicated at this time. OT will sign off      If plan is discharge home, recommend the following:   Assistance with cooking/housework;Help with stairs or ramp for entrance     Functional Status Assessment   Patient has not had a recent decline in their functional status     Equipment Recommendations   None recommended by OT     Recommendations for Other Services         Precautions/Restrictions   Precautions Precautions: Fall Recall of Precautions/Restrictions: Intact Restrictions Weight Bearing Restrictions Per Provider Order: No     Mobility Bed Mobility Overal bed mobility: Modified Independent             General bed mobility comments: sitting EOB upon arrival, returned to supine at end of session    Transfers Overall transfer level: Modified independent Equipment used: None                      Balance Overall balance assessment: Mild deficits observed, not formally tested                                         ADL either performed or assessed with clinical judgement   ADL Overall ADL's : Independent                                        General ADL Comments: no LOB, difficulty or incoordination; HR increasing with exertion. HR at rest 111-119, going up to 126 during in room ADL and ADL activity     Vision Ability to See in Adequate Light: 0 Adequate Patient Visual Report: No change from baseline       Perception         Praxis         Pertinent Vitals/Pain Pain Assessment Pain Assessment: No/denies pain     Extremity/Trunk Assessment Upper Extremity Assessment Upper Extremity Assessment: Overall WFL for tasks assessed;Right hand dominant   Lower Extremity Assessment Lower Extremity Assessment: Defer to PT evaluation   Cervical / Trunk Assessment Cervical / Trunk Assessment: Kyphotic   Communication Communication Communication: No apparent difficulties   Cognition Arousal: Alert Behavior During Therapy: WFL for tasks assessed/performed Cognition: No apparent impairments                               Following commands: Intact       Cueing  General  Comments   Cueing Techniques: Verbal cues  resting HR 124 bpm, elevating to 144 following ambulation of 250' w/out an AD. Pt took standing rest break with HR returning to 124 bpm throughout.   Exercises     Shoulder Instructions      Home Living Family/patient expects to be discharged to:: Private residence Living Arrangements: Spouse/significant other Available Help at Discharge: Family;Available 24 hours/day Type of Home: House Home Access: Stairs to enter [employer] of Steps: 3 Entrance Stairs-Rails: None Home Layout: Two level;Full bath on main level;Able to live on main level with bedroom/bathroom     Bathroom Shower/Tub: Tub/shower unit   Bathroom Toilet: Handicapped height Bathroom Accessibility: Yes   Home Equipment: [employer] (2 wheels)          Prior Functioning/Environment Prior Level of Function : Independent/Modified Independent;Driving             Mobility Comments: Ind ADLs  Comments: Ind with ADLs/selfcare    OT Problem List: Decreased activity tolerance;Impaired balance (sitting and/or standing)   OT Treatment/Interventions:        OT Goals(Current goals can be found in the care plan section)   Acute Rehab OT Goals Patient Stated Goal: go home OT Goal Formulation: All assessment and education complete, DC therapy   OT Frequency:       Co-evaluation              AM-PAC OT 6 Clicks Daily Activity     Outcome Measure Help from another person eating meals?: None Help from another person taking care of personal grooming?: None Help from another person toileting, which includes using toliet, bedpan, or urinal?: None Help from another person bathing (including washing, rinsing, drying)?: None Help from another person to put on and taking off regular upper body clothing?: None Help from another person to put on and taking off regular lower body clothing?: None 6 Click Score: 24   End of Session Equipment Utilized During Treatment: Gait belt Nurse Communication: Mobility status  Activity Tolerance: Patient tolerated treatment well Patient left: in bed  OT Visit Diagnosis: Other abnormalities of gait and mobility (R26.89)                Time: 8891-8865 OT Time Calculation (min): 26 min Charges:  OT General Charges $OT Visit: 1 Visit OT Evaluation $OT Eval Moderate Complexity: 1 Mod OT Treatments $Therapeutic Activity: 8-22 mins    Jacques Karna Loose 06/13/2024, 12:53 PM

## 2024-06-13 NOTE — Telephone Encounter (Signed)
 Pharmacy Patient Advocate Encounter  Insurance verification completed.    The patient is insured through [employer] MEDICAID.     Ran test claim for Eliquis  5mg  tablet and the current 30 day co-pay is $4.   This test claim was processed through [employer]- copay amounts may vary at other pharmacies due to [employer], or as the patient moves through the different stages of their insurance plan.

## 2024-06-13 NOTE — Evaluation (Signed)
 Physical Therapy Evaluation Patient Details Name: Jay Ware MRN: [id number] DOB: 1961/03/07 Today's Date: 06/13/2024  History of Present Illness  Pt is a 63 y.o. M presenting to [facility] on 06/12/24 with SOB and chest pain. Pt admitted with A-fib with RVR. PMH is significant for A-fib, CAD, NSTEMI, CKD, HTN, and TAA.  Clinical Impression  Prior to admittance, pt was independent with mobility and ADLs. Pt presents to evaluation with deficits in activity tolerance, and balance. Pt was able to ambulate with IV pole and no physical assistance. Pt demonstrates reduced gait speed with dual tasks, and would benefit from higher level balance assessment. PT will continue to treat pt while he is admitted. No follow up therapies recommended at this time.       If plan is discharge home, recommend the following: Help with stairs or ramp for entrance   Can travel by private vehicle        Equipment Recommendations None recommended by PT  Recommendations for Other Services       Functional Status Assessment Patient has had a recent decline in their functional status and demonstrates the ability to make significant improvements in function in a reasonable and predictable amount of time.     Precautions / Restrictions Precautions Precautions: Fall Recall of Precautions/Restrictions: Intact Restrictions: watch HR Weight Bearing Restrictions Per Provider Order: No      Mobility  Bed Mobility Overal bed mobility: Modified Independent             General bed mobility comments: HOB flat. Pulled on L bed railing to obtain seated position.    Transfers Overall transfer level: Modified independent Equipment used: None               General transfer comment: increased time to complete    Ambulation/Gait Ambulation/Gait assistance: Supervision Gait Distance (Feet): 500 Feet Assistive device: IV Pole Gait Pattern/deviations: Step-through pattern Gait velocity: functional Gait  velocity interpretation: 1.31 - 2.62 ft/sec, indicative of limited community ambulator   General Gait Details: Pt ambulates with reciprocal gait pattern with LEs in bilateral ER. Pt's gait speed reduces during dual tasks but no LOB noted.  Stairs            Wheelchair Mobility     Tilt Bed    Modified Rankin (Stroke Patients Only)       Balance Overall balance assessment: Mild deficits observed, not formally tested                                           Pertinent Vitals/Pain Pain Assessment Pain Assessment: No/denies pain    Home Living Family/patient expects to be discharged to:: Private residence Living Arrangements: Spouse/significant other Available Help at Discharge: Family;Available 24 hours/day Type of Home: House Home Access: Stairs to enter Entrance Stairs-Rails: None Entrance Stairs-Number of Steps: 3   Home Layout: Two level;Full bath on main level;Able to live on main level with bedroom/bathroom Home Equipment: [employer] (2 wheels)      Prior Function Prior Level of Function : Independent/Modified Independent             Mobility Comments: indep ADLs Comments: indep     Extremity/Trunk Assessment   Upper Extremity Assessment Upper Extremity Assessment: Overall WFL for tasks assessed (5/5 throughout)    Lower Extremity Assessment Lower Extremity Assessment: Overall WFL for tasks assessed (5/5 throughout)  Cervical / Trunk Assessment Cervical / Trunk Assessment: Kyphotic  Communication   Communication Communication: No apparent difficulties    Cognition Arousal: Alert Behavior During Therapy: WFL for tasks assessed/performed   PT - Cognitive impairments: No apparent impairments                         Following commands: Intact       Cueing Cueing Techniques: Verbal cues     General Comments General comments (skin integrity, edema, etc.): resting HR 124 bpm, elevating to 144 following  ambulation of 250' w/out an AD. Pt took standing rest break with HR returning to 124 bpm throughout.    Exercises     Assessment/Plan    PT Assessment Patient needs continued PT services  PT Problem List Decreased activity tolerance;Decreased balance;Decreased mobility;Cardiopulmonary status limiting activity       PT Treatment Interventions Gait training;Stair training;Functional mobility training;Therapeutic activities;Therapeutic exercise;Balance training    PT Goals (Current goals can be found in the Care Plan section)  Acute Rehab PT Goals Patient Stated Goal: to go home PT Goal Formulation: With patient Time For Goal Achievement: 06/27/24 Potential to Achieve Goals: Good Additional Goals Additional Goal #1: pt will score >19 on DGI demonstraing reduced risk of falls    Frequency Min 1X/week     Co-evaluation               AM-PAC PT 6 Clicks Mobility  Outcome Measure Help needed turning from your back to your side while in a flat bed without using bedrails?: None Help needed moving from lying on your back to sitting on the side of a flat bed without using bedrails?: None Help needed moving to and from a bed to a chair (including a wheelchair)?: None Help needed standing up from a chair using your arms (e.g., wheelchair or bedside chair)?: None Help needed to walk in hospital room?: A Little Help needed climbing 3-5 steps with a railing? : Total 6 Click Score: 20    End of Session Equipment Utilized During Treatment: Gait belt Activity Tolerance: Patient tolerated treatment well Patient left: in bed;with call bell/phone within reach Nurse Communication: Mobility status PT Visit Diagnosis: Other abnormalities of gait and mobility (R26.89)    Time: 9140-9072 PT Time Calculation (min) (ACUTE ONLY): 28 min   Charges:   PT Evaluation $PT Eval Moderate Complexity: 1 Mod   PT General Charges $$ ACUTE PT VISIT: 1 Visit         Jay Ware DPT Acute  Rehab Services [phone] Prefer contact via chat   Jay Ware 06/13/2024, 9:41 AM

## 2024-06-13 NOTE — Progress Notes (Signed)
 Echocardiogram 2D Echocardiogram has been performed.  Jay Ware 06/13/2024, 10:52 AM

## 2024-06-13 NOTE — Progress Notes (Signed)
 Echocardiogram Attempted exam at 9am - PT with patient, will attempt again later.  Jay Ware 06/13/2024, 9:02 AM

## 2024-06-13 NOTE — H&P (View-Only) (Signed)
 Progress Note  Patient Name: Jay Ware Date of Encounter: 06/13/2024 [facility] Cardiologist: Jay Archer, MD   Interval Summary   Resting comfortably in bed  Reports good urine output Says most of his issues last night were panic attacks Reviewed plan for possible TEE/DCCV tomorrow if he remains in A-Fib  Vital Signs Vitals:   06/13/24 0200 06/13/24 0215 06/13/24 0319 06/13/24 0734  BP:   113/76 107/70  Pulse: (!) 50 (!) 56 (!) 56 84  Resp:   20 (!) 23  Temp:   99.3 F (37.4 C) 98.4 F (36.9 C)  TempSrc:   Oral Oral  SpO2: (!) 83% 100% 100% 99%  Weight:   134.9 kg   Height:        Intake/Output Summary (Last 24 hours) at 06/13/2024 0815 Last data filed at 06/13/2024 0500 Gross per 24 hour  Intake 234.66 ml  Output 2200 ml  Net -1965.34 ml      06/13/2024    3:19 AM 06/12/2024    1:32 PM 06/19/2022    4:07 AM  Last 3 Weights  Weight (lbs) 297 lb 6.4 oz 308 lb 10.3 oz 308 lb 13.8 oz  Weight (kg) 134.9 kg 140 kg 140.1 kg     Telemetry/ECG  Atrial fibrillation, HR 90s to 100s, PVCs- Personally Reviewed  Physical Exam  GEN: No acute distress.   Neck: No JVD Cardiac: iRRR, no murmurs, rubs, or gallops.  Respiratory: Clear to auscultation bilaterally. GI: Soft, nontender, non-distended  MS: 1+ LE edema  Assessment & Plan   Jay Ware is a 63 y.o. male with a hx of CAD c/b NSTEMI s/p PCI (2000 and 2023), PAF, PAD s/p Fem artery stent (2011), prior tobacco use w/ active vaping, morbid obesity, OSA (not on CPAP), CKD 2, TAA, HTN, HLD, and ischemic cardiomyopathy (EF in 2023 60-65%)  who is being seen for atrial fibrillation   Paroxysmal atrial fibrillation with RVR Known history of paroxysmal atrial fibrillation Patient reports noncompliance with medications prior to admission TSH normal, electrolytes normal Currently in A-fib with HR 90s to 100s Currently on IV diltiazem  running at 12.5 mg/hr Restarted on Eliquis  5 mg BID Pending  updated echocardiogram Plan for TEE/DCCV if remains in A-fib on Friday 12/12 NPO at midnight  Informed Consent   Shared Decision Making/Informed Consent   The risks [stroke, cardiac arrhythmias rarely resulting in the need for a temporary or permanent pacemaker, skin irritation or burns, esophageal damage, perforation (1:10,000 risk), bleeding, pharyngeal hematoma as well as other potential complications associated with conscious sedation including aspiration, arrhythmia, respiratory failure and death], benefits (treatment guidance, restoration of normal sinus rhythm, diagnostic support) and alternatives of a transesophageal echocardiogram guided cardioversion were discussed in detail with Jay Ware and he is willing to proceed.     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 297 lb  Patient reports good urine output and improvement in symptoms  Currently on IV diltiazem  as above Continue to monitor daily weights, strict I's and O's, daily BMPs Pending updated echocardiogram Consider additional dose of IV Lasix  this morning   CAD s/p PCI to RPDA 2000, to OM1 2023  Hyperlipidemia PAD s/p femoral angioplasty in 2011 Patient denied any chest pain 06/13/2024: ALT 67; HDL 38; LDL Cholesterol 65  Continue Lipitor  80 mg Continue Zetia  10 mg Follow-up echocardiogram No long-term antiplatelet therapy with Eliquis  use  Thoracic aortic aneurysm CTA chest this admission showed: Stable 4.4 cm ascending thoracic aortic aneurysm, no evidence of dissection Continue to monitor annually   For questions or updates, please contact [facility] Please consult [email or web address] for contact info under     Signed, Jay DELENA Donath, PA-C   Patient seen and examined, note reviewed with the signed Advanced Practice Provider. I personally reviewed laboratory data, imaging studies and relevant notes. I independently examined the patient and formulated the important aspects of the plan. I have personally discussed the plan with the patient and/or family. Comments or changes to the note/plan are indicated below.  HPI: Jay Ware is a 63 y.o. male with a hx of CAD c/b NSTEMI s/p PCI (2000 and 2023), PAF, PAD s/p Fem artery stent (2011), prior tobacco use w/ active vaping, morbid obesity, OSA (not on CPAP), CKD 2, TAA, HTN, HLD, and ischemic cardiomyopathy ( EF in 2023 60-65%)  who is being seen 06/12/2024 for the evaluation of atrial fibrillation at the request of Jay Grams MD.   Feeling better today after diuresing overnight and was able to get some sleep. He did eat this morning.   My Exam:  Physical Exam Vitals and nursing note reviewed.  Constitutional:      Appearance: Normal appearance.  HENT:     Head: Normocephalic and atraumatic.  Eyes:     Conjunctiva/sclera: Conjunctivae normal.  Neck:     Comments: Mild JVD to about 2 cm above clavicle Cardiovascular:     Rate and Rhythm: Tachycardia present. Rhythm irregular.  Pulmonary:     Effort: Pulmonary effort is normal.     Breath sounds: Normal breath sounds.  Skin:    Coloration: Skin is not jaundiced or pale.  Neurological:     Mental Status: He is alert.      Telemetry: AF with RVR - Personally reviewed EKG: AF with RVR - Personally reviewed  Assessment & Plan:   Atrial fibrillation with RVR-has been noncompliant with his home anticoagulation and other meds.  Currently on a Cardizem  drip with improved rates and improving volume status. TEE with cardioversion tomorrow, unable to do today as he ate already and only had one dose of Eliquis . Suspected mild acute on chronic heart failure exacerbation, likely  tachycardia induced-volume status significantly improved. Echocardiogram. Will give a dose of lasix  80 IV this AM and likely PO starting tomorrow.  OSA not on CPAP CAD s/p PCI in 2000 and 2023 to OM1 Hypertension Hyperlipidemia Thoracic aortic aneurysm Medication noncompliance strongly advised to adhere to his medication regimen PAD s/p femoral angioplasty in 2011    Signed, Lamoyne Palencia, DO [place]  [facility]  06/13/2024 9:52 AM

## 2024-06-13 NOTE — Progress Notes (Signed)
 Progress Note   Patient: Jay Ware FMW:[id number] DOB: 1960/08/13 DOA: 06/12/2024     0 DOS: the patient was seen and examined on 06/13/2024   Brief hospital course:  Jay Ware is a 48 yeare old Male with history of paroxysmal atrial fibrillation, CAD, non-STEMI, CKD, HTN, TAA, .... Presenting mainly with shortness of breath, difficulty sleeping, feeling as if he is having panic attack, worsening palpitation.  Especially with laying down and with exertion. Brief transient chest pain which has resolved now.  Denies of having any recent illnesses such as cough, fever, chills, nausea, vomiting.  Also report a little more swelling of lower extremities than usual.   Reports that he has not been compliant with his medication including Eliquis  for past year or so.  Stating that medication did not make him feel well.  Assessment and Plan:  A-fib [facility]) with rapid ventricular response - Per EDP on admission heart rate around 160s Still continues to be on Cardizem  drip Patient has been seen by cardiologist Being planned for TEE with cardioversion tomorrow Continue Eliquis  and metoprolol  XL at 25 mg daily Follow-up on echocardiogram   CAD (coronary artery disease) -History of  CAD w STEMI, S/P PCI in 2000 and 2023 - Last echo in 2023, EF 60-65%, -Noncompliant with current medications -Continue statin therapy follow-up on echocardiogram   CKD (chronic kidney disease) stage 2, GFR 60-89 ml/min Monitor renal function closely   Paroxysmal atrial fibrillation (HCC) Now converted to persistent A-fib Awaiting cardioversion   OSA (obstructive sleep apnea) History of obstructive sleep apnea, patient need sleep study as an outpatient-would likely will benefit from CPAP       Hyperlipidemia LDL goal <70 Obtaining a lipid panel - Patient has been noncompliant with her statin medication - Restarting Lipitor  at 40 mg, if intolerable need a trial of alternative statins - LDL goal less  than 70     Essential hypertension Currently on diltiazem  drip   Thoracic ascending aortic aneurysm Outpatient monitoring     Consults called: Cardiology DVT prophylaxis:  SCDs Start: 06/12/24 1522 apixaban  (ELIQUIS ) tablet 5 mg    Code Status:   Code Status: Full Code     Subjective:  Patient seen and examined at bedside this morning Denies chest pain nausea vomiting abdominal pain Still having persistent A-fib  Physical Exam: Constitutional: NAD, calm, comfortable Eyes: PERRL, lids and conjunctivae normal ENMT: Mucous membranes are moist. Posterior pharynx clear of any exudate or lesions.Normal dentition.  Neck: normal, supple, no masses, no thyromegaly Respiratory: clear to auscultation bilaterally, no wheezing, no crackles. Normal respiratory effort. No accessory muscle use.  Cardiovascular: Irregularly irregular, no murmurs / rubs / gallops. No extremity edema. 2+ pedal pulses. No carotid bruits.  Abdomen: no tenderness, no masses palpated. No hepatosplenomegaly. Bowel sounds positive.  Musculoskeletal: no clubbing / cyanosis. No joint deformity upper and lower extremities. Good ROM, no contractures. Normal muscle tone.  Neurologic: CN II-XII grossly intact. Sensation intact, DTR normal. Strength 5/5 in all 4.  Psychiatric: Normal judgment and insight. Alert and oriented x 3. Normal mood.  Skin: no rashes, lesions, ulcers. No induration   Vitals:   06/13/24 0319 06/13/24 0734 06/13/24 1201 06/13/24 1409  BP: 113/76 107/70 109/74 119/85  Pulse: (!) 56 84 (!) 101 (!) 54  Resp: 20 (!) 23 (!) 21 (!) 21  Temp: 99.3 F (37.4 C) 98.4 F (36.9 C) 98.9 F (37.2 C) 98.6 F (37 C)  TempSrc: Oral Oral Oral Oral  SpO2: 100%  99% 93% 93%  Weight: 134.9 kg     Height:        Data Reviewed: CT scan of the chest did not show any PE     Latest Ref Rng & Units 06/13/2024    4:21 AM 06/12/2024    1:41 PM 06/22/2022   11:03 AM  BMP  Glucose 70 - 99 mg/dL 92  889  85    BUN 8 - 23 mg/dL 16  18  18    Creatinine 0.61 - 1.24 mg/dL 8.54  8.56  8.50   BUN/Creat Ratio 10 - 24   12   Sodium 135 - 145 mmol/L 138  139  140   Potassium 3.5 - 5.1 mmol/L 3.9  4.5  4.4   Chloride 98 - 111 mmol/L 104  106  105   CO2 22 - 32 mmol/L 25  27  22    Calcium  8.9 - 10.3 mg/dL 8.4  8.6  9.4        Latest Ref Rng & Units 06/12/2024    1:41 PM 06/19/2022   12:17 AM 06/18/2022    4:59 AM  CBC  WBC 4.0 - 10.5 K/uL 10.9  8.2  9.3   Hemoglobin 13.0 - 17.0 g/dL 84.1  86.1  84.5   Hematocrit 39.0 - 52.0 % 48.0  41.6  45.2   Platelets 150 - 400 K/uL 203  201  241        Author: Drue ONEIDA Potter, MD 06/13/2024 3:51 PM  For on call review [email or web address].

## 2024-06-13 NOTE — TOC CM/SW Note (Signed)
 Transition of Care [facility]) - Inpatient Brief Assessment   Patient Details  Name: Jay Ware MRN: [id number] Date of Birth: [birth year]  Transition of Care [facility] CM/SW Contact:    Sudie Erminio Deems, RN Phone Number: 06/13/2024, 12:33 PM   Clinical Narrative: Patient presented for evaluation of atrial fibrillation-per notes, plan for possible TEE/DCCV 06-14-24. PCP appointment arranged for patient and added to the AVS. ICM will continue to follow for additional needs as the patient progresses.    Transition of Care Asessment: Insurance and Status: Insurance coverage has been reviewed Patient has primary care physician: Yes (appointment scheduled) Prior/Current Home Services: No current home services Social Drivers of Health Review: SDOH reviewed no interventions necessary Readmission risk has been reviewed: Yes Transition of care needs: no transition of care needs at this time

## 2024-06-14 ENCOUNTER — Inpatient Hospital Stay (HOSPITAL_COMMUNITY)

## 2024-06-14 ENCOUNTER — Encounter (HOSPITAL_COMMUNITY)
Admission: EM | Disposition: A | Discharge status: Home / Self Care | Payer: Self-pay | Source: Home / Self Care | Attending: Internal Medicine

## 2024-06-14 ENCOUNTER — Encounter (HOSPITAL_COMMUNITY): Payer: Self-pay | Admitting: Student in an Organized Health Care Education/Training Program

## 2024-06-14 ENCOUNTER — Other Ambulatory Visit (HOSPITAL_COMMUNITY): Payer: Self-pay

## 2024-06-14 DIAGNOSIS — I3139 Other pericardial effusion (noninflammatory): Secondary | ICD-10-CM

## 2024-06-14 DIAGNOSIS — I4891 Unspecified atrial fibrillation: Secondary | ICD-10-CM | POA: Diagnosis not present

## 2024-06-14 HISTORY — PX: CARDIOVERSION: EP1203

## 2024-06-14 HISTORY — PX: TRANSESOPHAGEAL ECHOCARDIOGRAM (CATH LAB): EP1270

## 2024-06-14 LAB — BASIC METABOLIC PANEL WITH GFR
Anion gap: 10 (ref 5–15)
BUN: 22 mg/dL (ref 8–23)
CO2: 24 mmol/L (ref 22–32)
Calcium: 8.5 mg/dL — ABNORMAL LOW (ref 8.9–10.3)
Chloride: 103 mmol/L (ref 98–111)
Creatinine, Ser: 1.71 mg/dL — ABNORMAL HIGH (ref 0.61–1.24)
GFR, Estimated: 44 mL/min — ABNORMAL LOW (ref >60)
Glucose, Bld: 93 mg/dL (ref 70–99)
Potassium: 3.5 mmol/L (ref 3.5–5.1)
Sodium: 137 mmol/L (ref 135–145)

## 2024-06-14 LAB — CBC
HCT: 47 % (ref 39.0–52.0)
Hemoglobin: 16.3 g/dL (ref 13.0–17.0)
MCH: 30 pg (ref 26.0–34.0)
MCHC: 34.7 g/dL (ref 30.0–36.0)
MCV: 86.4 fL (ref 80.0–100.0)
Platelets: 223 K/uL (ref 150–400)
RBC: 5.44 MIL/uL (ref 4.22–5.81)
RDW: 13.4 % (ref 11.5–15.5)
WBC: 12.6 K/uL — ABNORMAL HIGH (ref 4.0–10.5)
nRBC: 0 % (ref 0.0–0.2)

## 2024-06-14 LAB — ECHO TEE

## 2024-06-14 LAB — GLUCOSE, CAPILLARY: Glucose-Capillary: 103 mg/dL — ABNORMAL HIGH (ref 70–99)

## 2024-06-14 SURGERY — TRANSESOPHAGEAL ECHOCARDIOGRAM (TEE) (CATHLAB)
Anesthesia: Monitor Anesthesia Care

## 2024-06-14 MED ORDER — APIXABAN 5 MG PO TABS
5.0000 mg | ORAL_TABLET | Freq: Two times a day (BID) | ORAL | 3 refills | Status: AC
  Filled 2024-06-14: qty 60, 30d supply, fill #0

## 2024-06-14 MED ORDER — APIXABAN 5 MG PO TABS
5.0000 mg | ORAL_TABLET | Freq: Two times a day (BID) | ORAL | Status: DC
  Administered 2024-06-14: 5 mg via ORAL
  Filled 2024-06-14: qty 1

## 2024-06-14 MED ORDER — METOPROLOL TARTRATE 5 MG/5ML IV SOLN
5.0000 mg | Freq: Once | INTRAVENOUS | Status: AC
  Administered 2024-06-14: 5 mg via INTRAVENOUS
  Filled 2024-06-14: qty 5

## 2024-06-14 MED ORDER — ATORVASTATIN CALCIUM 80 MG PO TABS
80.0000 mg | ORAL_TABLET | Freq: Every day | ORAL | 3 refills | Status: AC
  Filled 2024-06-14: qty 30, 30d supply, fill #0

## 2024-06-14 MED ORDER — PHENYLEPHRINE 80 MCG/ML (10ML) SYRINGE FOR IV PUSH (FOR BLOOD PRESSURE SUPPORT)
PREFILLED_SYRINGE | INTRAVENOUS | Status: DC | PRN
  Administered 2024-06-14: 160 ug via INTRAVENOUS

## 2024-06-14 MED ORDER — EZETIMIBE 10 MG PO TABS
10.0000 mg | ORAL_TABLET | Freq: Every day | ORAL | 3 refills | Status: AC
  Filled 2024-06-14: qty 30, 30d supply, fill #0

## 2024-06-14 MED ORDER — LIDOCAINE 2% (20 MG/ML) 5 ML SYRINGE
INTRAMUSCULAR | Status: DC | PRN
  Administered 2024-06-14: 100 mg via INTRAVENOUS

## 2024-06-14 MED ORDER — AMIODARONE HCL 200 MG PO TABS
ORAL_TABLET | ORAL | 0 refills | Status: AC
  Filled 2024-06-14: qty 58, 37d supply, fill #0

## 2024-06-14 MED ORDER — AMIODARONE HCL IN DEXTROSE 360-4.14 MG/200ML-% IV SOLN
60.0000 mg/h | INTRAVENOUS | Status: AC
  Administered 2024-06-14: 60 mg/h via INTRAVENOUS
  Filled 2024-06-14: qty 200

## 2024-06-14 MED ORDER — AMIODARONE HCL IN DEXTROSE 360-4.14 MG/200ML-% IV SOLN
30.0000 mg/h | INTRAVENOUS | Status: DC
  Filled 2024-06-14: qty 200

## 2024-06-14 MED ORDER — METOPROLOL SUCCINATE ER 50 MG PO TB24
50.0000 mg | ORAL_TABLET | Freq: Every day | ORAL | Status: DC
  Administered 2024-06-14: 50 mg via ORAL
  Filled 2024-06-14: qty 1

## 2024-06-14 MED ORDER — METOPROLOL SUCCINATE ER 50 MG PO TB24
50.0000 mg | ORAL_TABLET | Freq: Every day | ORAL | 3 refills | Status: AC
  Filled 2024-06-14: qty 30, 30d supply, fill #0

## 2024-06-14 MED ORDER — PROPOFOL 10 MG/ML IV BOLUS
INTRAVENOUS | Status: DC | PRN
  Administered 2024-06-14: 150 mg via INTRAVENOUS

## 2024-06-14 SURGICAL SUPPLY — 1 items: PAD DEFIB RADIO PHYSIO CONN (PAD) ×1 IMPLANT

## 2024-06-14 NOTE — Anesthesia Postprocedure Evaluation (Signed)
 Anesthesia Post Note  Patient: Jay Ware  Procedure(s) Performed: TRANSESOPHAGEAL ECHOCARDIOGRAM CARDIOVERSION     Patient location during evaluation: Cath Lab Anesthesia Type: MAC Level of consciousness: awake and alert Pain management: pain level controlled Vital Signs Assessment: post-procedure vital signs reviewed and stable Respiratory status: spontaneous breathing, nonlabored ventilation and respiratory function stable Cardiovascular status: stable and blood pressure returned to baseline Postop Assessment: no apparent nausea or vomiting Anesthetic complications: no   No notable events documented.  Last Vitals:  Vitals:   06/14/24 0949 06/14/24 0950  BP: 110/77 (!) 110/91  Pulse: 86   Resp: (!) 24   Temp:    SpO2:  96%    Last Pain:  Vitals:   06/14/24 0949  TempSrc:   PainSc: 0-No pain                 [name]

## 2024-06-14 NOTE — CV Procedure (Signed)
° °  TRANSESOPHAGEAL ECHOCARDIOGRAM GUIDED DIRECT CURRENT CARDIOVERSION  NAME:  Jay Ware    MRN: [id number] DOB:  Nov 22, 1960    ADMIT DATE: 06/12/2024  INDICATIONS: Symptomatic atrial fibrillation  PROCEDURE:   Informed consent was obtained prior to the procedure. The risks, benefits and alternatives for the procedure were discussed and the patient comprehended these risks.  Risks include, but are not limited to, cough, sore throat, vomiting, nausea, somnolence, esophageal and stomach trauma or perforation, bleeding, low blood pressure, aspiration, pneumonia, infection, trauma to the teeth and death.    After a procedural time-out, the oropharynx was anesthetized and the patient was sedated by the anesthesia service. The transesophageal probe was inserted in the esophagus and stomach without difficulty and multiple views were obtained. Anesthesia was monitored by the anesthesiology team.  COMPLICATIONS:    Complications: No complications Patient tolerated procedure well.  KEY FINDINGS:  Limited TEE to rule out LAA thrombus No LAA thrombus seen Successful DCCV.  Full Report to follow.   CARDIOVERSION:     Indications:  Symptomatic Atrial Fibrillation  Procedure Details:  Once the TEE was complete, the patient had the defibrillator pads placed in the anterior and posterior position. Once an appropriate level of sedation was confirmed, the patient was cardioverted  x1  with 360J of biphasic synchronized energy.  The patient converted to NSR.  There were no apparent complications.  The patient had normal neuro status and respiratory status post procedure with vitals stable as recorded elsewhere.  Adequate airway was maintained throughout and vital signs monitored per protocol.  The was advised to continue anticoagulation for minimum 4 week with interruption.   All questions has been answered at this time.  Joziyah Roblero T. Floretta HEATH, MD   [facility]  9:49 AM

## 2024-06-14 NOTE — Interval H&P Note (Signed)
 History and Physical Interval Note:  06/14/2024 8:28 AM  Jay Ware  has presented today for surgery, with the diagnosis of afib.  The various methods of treatment have been discussed with the patient and family. After consideration of risks, benefits and other options for treatment, the patient has consented to  Procedures: TRANSESOPHAGEAL ECHOCARDIOGRAM (N/A) CARDIOVERSION (N/A) as a surgical intervention.  The patient's history has been reviewed, patient examined, no change in status, stable for surgery.  I have reviewed the patient's chart and labs.  Questions were answered to the patient's satisfaction.     [name]

## 2024-06-14 NOTE — Progress Notes (Incomplete)
 Progress Note   Patient: Jay Ware FMW:[id number] DOB: January 30, 1961 DOA: 06/12/2024     1 DOS: the patient was seen and examined on 06/14/2024      Brief hospital course:  Jay Ware is a 72 yeare old Male with history of paroxysmal atrial fibrillation, CAD, non-STEMI, CKD, HTN, TAA, .... Presenting mainly with shortness of breath, difficulty sleeping, feeling as if he is having panic attack, worsening palpitation.  Especially with laying down and with exertion. Brief transient chest pain which has resolved now.  Denies of having any recent illnesses such as cough, fever, chills, nausea, vomiting.  Also report a little more swelling of lower extremities than usual.   Reports that he has not been compliant with his medication including Eliquis  for past year or so.  Stating that medication did not make him feel well.   Assessment and Plan:   A-fib [facility]) with rapid ventricular response - Per EDP on admission heart rate around 160s Still continues to be on Cardizem  drip Patient has been seen by cardiologist Being planned for TEE with cardioversion tomorrow Continue Eliquis  and metoprolol  XL at 25 mg daily Follow-up on echocardiogram   CAD (coronary artery disease) -History of  CAD w STEMI, S/P PCI in 2000 and 2023 - Last echo in 2023, EF 60-65%, -Noncompliant with current medications -Continue statin therapy follow-up on echocardiogram   CKD (chronic kidney disease) stage 2, GFR 60-89 ml/min Monitor renal function closely   Paroxysmal atrial fibrillation (HCC) Now converted to persistent A-fib Awaiting cardioversion   OSA (obstructive sleep apnea) History of obstructive sleep apnea, patient need sleep study as an outpatient-would likely will benefit from CPAP       Hyperlipidemia LDL goal <70 Obtaining a lipid panel - Patient has been noncompliant with her statin medication - Restarting Lipitor  at 40 mg, if intolerable need a trial of alternative statins - LDL  goal less than 70     Essential hypertension Currently on diltiazem  drip   Thoracic ascending aortic aneurysm Outpatient monitoring   Physical Exam:  Constitutional: NAD, calm, comfortable Eyes: PERRL, lids and conjunctivae normal ENMT: Mucous membranes are moist. Posterior pharynx clear of any exudate or lesions.Normal dentition.  Neck: normal, supple, no masses, no thyromegaly Respiratory: clear to auscultation bilaterally, no wheezing, no crackles. Normal respiratory effort. No accessory muscle use.  Cardiovascular: Irregularly irregular, no murmurs / rubs / gallops. No extremity edema. 2+ pedal pulses. No carotid bruits.  Abdomen: no tenderness, no masses palpated. No hepatosplenomegaly. Bowel sounds positive.  Musculoskeletal: no clubbing / cyanosis. No joint deformity upper and lower extremities. Good ROM, no contractures. Normal muscle tone.  Neurologic: CN II-XII grossly intact. Sensation intact, DTR normal. Strength 5/5 in all 4.  Psychiatric: Normal judgment and insight. Alert and oriented x 3. Normal mood.  Skin: no rashes, lesions, ulcers. No induration   Data Reviewed:     Latest Ref Rng & Units 06/14/2024    3:15 AM 06/12/2024    1:41 PM 06/19/2022   12:17 AM  CBC  WBC 4.0 - 10.5 K/uL 12.6  10.9  8.2   Hemoglobin 13.0 - 17.0 g/dL 83.6  84.1  86.1   Hematocrit 39.0 - 52.0 % 47.0  48.0  41.6   Platelets 150 - 400 K/uL 223  203  201        Latest Ref Rng & Units 06/14/2024    3:15 AM 06/13/2024    4:21 AM 06/12/2024    1:41 PM  BMP  Glucose 70 - 99 mg/dL 93  92  889   BUN 8 - 23 mg/dL 22  16  18    Creatinine 0.61 - 1.24 mg/dL 8.28  8.54  8.56   Sodium 135 - 145 mmol/L 137  138  139   Potassium 3.5 - 5.1 mmol/L 3.5  3.9  4.5   Chloride 98 - 111 mmol/L 103  104  106   CO2 22 - 32 mmol/L 24  25  27    Calcium  8.9 - 10.3 mg/dL 8.5  8.4  8.6      Vitals:   06/14/24 1000 06/14/24 1020 06/14/24 1025 06/14/24 1147  BP: 102/71 (!) 131/93 (!) 112/90 129/87  Pulse:  85 86 86 (!) 118  Resp: 14 20 19    Temp:      TempSrc:      SpO2: 95% 95% 94%   Weight:      Height:         Author: Drue ONEIDA Potter, MD 06/14/2024 2:02 PM  For on call review [email or web address].

## 2024-06-14 NOTE — Transfer of Care (Signed)
 Immediate Anesthesia Transfer of Care Note  Patient: Jay Ware  Procedure(s) Performed: TRANSESOPHAGEAL ECHOCARDIOGRAM CARDIOVERSION  Patient Location: Cath Lab  Anesthesia Type:MAC  Level of Consciousness: awake, alert , and oriented  Airway & Oxygen Therapy: Patient Spontanous Breathing and Patient connected to nasal cannula oxygen  Post-op Assessment: Report given to RN and Post -op Vital signs reviewed and stable  Post vital signs: Reviewed and stable  Last Vitals:  Vitals Value Taken Time  BP    Temp    Pulse    Resp    SpO2      Last Pain:  Vitals:   06/14/24 0924  TempSrc:   PainSc: 0-No pain         Complications: No notable events documented.

## 2024-06-14 NOTE — Discharge Summary (Signed)
 Physician Discharge Summary   Patient: Jay Ware MRN: [id number] DOB: 1961-02-11  Admit date:     06/12/2024  Discharge date: 06/14/2024  Discharge Physician: Drue ONEIDA Potter   PCP: Corrington, Kip A, MD   Recommendations at discharge:  Follow-up with cardiology  Discharge Diagnoses: A-fib [facility]) with rapid ventricular response CAD (coronary artery disease) CKD (chronic kidney disease) stage 2, GFR 60-89 ml/min Paroxysmal atrial fibrillation (HCC) OSA (obstructive sleep apnea) Hyperlipidemia LDL goal <70 Essential hypertension Thoracic ascending aortic aneurysm  Hospital Course Jay Ware is a 72 yeare old Male with history of paroxysmal atrial fibrillation, CAD, non-STEMI, CKD, HTN, TAA, .... Presenting mainly with shortness of breath, difficulty sleeping, feeling as if he is having panic attack, worsening palpitation.  Especially with laying down and with exertion. Brief transient chest pain which has resolved now.  Patient was subsequently admitted with A-fib with RVR.  This did not resolve with Cardizem  infusion and so patient was taken for TEE with cardioversion by cardiologist.  Heart rate is now well-controlled and therefore patient has been cleared for discharge today by cardiologist to complete 1 week course of  amiodarone 400 mg BID x 1 week then 200 mg daily and continue toprol  50 mg.  Patient will follow-up with cardiologist as an outpatient.   Consultants: Cardiology Procedures performed: Cardioversion Disposition: Home Diet recommendation:  Cardiac diet DISCHARGE MEDICATION: Allergies as of 06/14/2024   No Known Allergies      Medication List     STOP taking these medications    acetaminophen  325 MG tablet Commonly known as: TYLENOL    clopidogrel  75 MG tablet Commonly known as: PLAVIX    lidocaine  5 % Commonly known as: Lidoderm        TAKE these medications    amiodarone 200 MG tablet Commonly known as: Pacerone Take 2 tablets (400 mg  total) by mouth 2 (two) times daily for 7 days, THEN 1 tablet (200 mg total) daily. Start taking on: June 14, 2024   apixaban  5 MG Tabs tablet Commonly known as: Eliquis  Take 1 tablet (5 mg total) by mouth 2 (two) times daily. What changed: how much to take   atorvastatin  80 MG tablet Commonly known as: LIPITOR  Take 1 tablet (80 mg total) by mouth at bedtime.   ezetimibe  10 MG tablet Commonly known as: ZETIA  Take 1 tablet (10 mg total) by mouth daily.   metoprolol  succinate 50 MG 24 hr tablet Commonly known as: TOPROL -XL Take 1 tablet (50 mg total) by mouth daily. Take with or immediately following a meal. Start taking on: June 15, 2024 What changed:  medication strength how much to take additional instructions   nicotine  14 mg/24hr patch Commonly known as: NICODERM CQ  - dosed in mg/24 hours Place 1 patch (14 mg total) onto the skin daily.   nitroGLYCERIN  0.3 MG SL tablet Commonly known as: NITROSTAT  Place 0.3 mg under the tongue every 5 (five) minutes as needed for chest pain.        Follow-up Information     Corrington, Kip A, MD Follow up.   Specialty: Family Medicine Why: TIME : 2:30 PM   PLEASE ARRIVE AT 2:00 PM DATE : JANUARY 05 , 2026 MONDAY  PLEASE BRING ALL CURRENT MEDICATION , ID and INS CARD, CO-PAY Contact information: [address] [place] [address] [phone]                Discharge Exam: Fredricka Weights   06/12/24 1332 06/13/24 0319 06/14/24  0456  Weight: (!) 140 kg 134.9 kg 135.5 kg   Vitals and nursing note reviewed.  Constitutional:      Appearance: Normal appearance.  HENT:     Head: Normocephalic and atraumatic.  Eyes:     Conjunctiva/sclera: Conjunctivae normal.  Cardiovascular: A-fib well-controlled Pulmonary:     Effort: Pulmonary effort is normal.     Breath sounds: Normal breath sounds.  Musculoskeletal:        General: No swelling or tenderness.  Skin:    Coloration: Skin is not jaundiced or  pale.  Neurological:     Mental Status: He is alert.     Condition at discharge: good  The results of significant diagnostics from this hospitalization (including imaging, microbiology, ancillary and laboratory) are listed below for reference.   Imaging Studies:   Microbiology: Results for orders placed or performed during the hospital encounter of 05/08/10  MRSA PCR Screening     Status: None   Collection Time: 05/08/10  5:09 PM  Result Value Ref Range Status   MRSA by PCR  NEGATIVE Final    NEGATIVE        The GeneXpert MRSA Assay (FDA approved for NASAL specimens only), is one component of a comprehensive MRSA colonization surveillance program. It is not intended to diagnose MRSA infection nor to guide or monitor treatment for MRSA infections.    Labs: CBC: Recent Labs  Lab 06/12/24 1341 06/14/24 0315  WBC 10.9* 12.6*  HGB 15.8 16.3  HCT 48.0 47.0  MCV 89.6 86.4  PLT 203 223   Basic Metabolic Panel: Recent Labs  Lab 06/12/24 1341 06/12/24 2000 06/13/24 0421 06/14/24 0315  NA 139  --  138 137  K 4.5  --  3.9 3.5  CL 106  --  104 103  CO2 27  --  25 24  GLUCOSE 110*  --  92 93  BUN 18  --  16 22  CREATININE 1.43*  --  1.45* 1.71*  CALCIUM  8.6*  --  8.4* 8.5*  MG 1.9  --   --   --   PHOS  --  3.2  --   --    Liver Function Tests: Recent Labs  Lab 06/12/24 2000 06/13/24 0421  AST 36 34  ALT 66* 67*  ALKPHOS 57 62  BILITOT 0.9 1.7*  PROT 5.9* 6.3*  ALBUMIN 3.0* 3.3*   CBG: Recent Labs  Lab 06/13/24 0733 06/14/24 0804  GLUCAP 106* 103*    Discharge time spent:  36 minutes.  Signed: Drue ONEIDA Potter, MD [facility] 06/14/2024

## 2024-06-14 NOTE — Anesthesia Preprocedure Evaluation (Signed)
 Anesthesia Evaluation  Patient identified by MRN, date of birth, ID band Patient awake    Reviewed: Allergy & Precautions, NPO status , Patient's Chart, lab work & pertinent test results  History of Anesthesia Complications Negative for: history of anesthetic complications  Airway Mallampati: IV  TM Distance: >3 FB Neck ROM: Full    Dental  (+) Teeth Intact, Dental Advisory Given   Pulmonary neg shortness of breath, sleep apnea , neg COPD, neg recent URI, former smoker   breath sounds clear to auscultation       Cardiovascular hypertension, Pt. on medications and Pt. on home beta blockers + CAD, + Past MI and + Peripheral Vascular Disease  + dysrhythmias Atrial Fibrillation  Rhythm:Irregular  1. Poor endocardial visualization no definity  given . Left ventricular  ejection fraction, by estimation, is 25 to 30%. The left ventricle has  severely decreased function. The left ventricle demonstrates global  hypokinesis. The left ventricular internal  cavity size was moderately dilated. There is mild left ventricular  hypertrophy. Left ventricular diastolic parameters are indeterminate.   2. Right ventricular systolic function is normal. The right ventricular  size is normal.   3. The mitral valve is abnormal. Trivial mitral valve regurgitation. No  evidence of mitral stenosis.   4. The aortic valve is normal in structure. Aortic valve regurgitation is  not visualized. No aortic stenosis is present.   5. Aortic dilatation noted. There is mild dilatation of the aortic root,  measuring 38 mm.   6. The inferior vena cava is normal in size with greater than 50%  respiratory variability, suggesting right atrial pressure of 3 mmHg.    1. Severe single-vessel coronary artery disease with thrombotic occlusion of moderate-caliber OM1 branch.  Otherwise, there is mild, nonobstructive coronary artery disease involving the LAD and RCA. 2. Widely  patent RPDA stent. 3. Upper normal left ventricular filling pressure (LVEDP 15 mmHg). 4. Severe tortuosity of distal left radial artery, successfully navigated with considerable difficulty.  Consider alternative approach for future catheterizations. 5. Successful PCI to OM1 using Synergy 2.5 x 24 mm drug-eluting stent (postdilated to 2.8 mm) with 0% residual stenosis and TIMI-3 flow.   Recommendations: 1. Aggressive secondary prevention of coronary artery disease. 2. Continue dual antiplatelet therapy with aspirin  and clopidogrel .  If no evidence of bleeding/vascular injury, apixaban  can be restarted as soon as tomorrow.  I recommend aspirin  + clopidogrel  + apixaban  for 1 week, after which time aspirin  can be discontinued and clopidogrel  + apixaban  continued for at least 12 months. 3. Gentle post catheterization hydration. 4. Follow-up echocardiogram.       Neuro/Psych negative neurological ROS  negative psych ROS   GI/Hepatic negative GI ROS, Neg liver ROS,,,  Endo/Other  negative endocrine ROS    Renal/GU Renal InsufficiencyRenal diseaseLab Results      Component                Value               Date                      NA                       137                 06/14/2024                K  3.5                 06/14/2024                CO2                      24                  06/14/2024                GLUCOSE                  93                  06/14/2024                BUN                      22                  06/14/2024                CREATININE               1.71 (H)            06/14/2024                CALCIUM                   8.5 (L)             06/14/2024                EGFR                     53 (L)              06/22/2022                GFRNONAA                 44 (L)              06/14/2024                Musculoskeletal   Abdominal   Peds  Hematology  (+) Blood dyscrasia Lab Results      Component                Value                Date                      WBC                      12.6 (H)            06/14/2024                HGB                      16.3                06/14/2024                HCT                      47.0  06/14/2024                MCV                      86.4                06/14/2024                PLT                      223                 06/14/2024              Anesthesia Other Findings   Reproductive/Obstetrics                              Anesthesia Physical Anesthesia Plan  ASA: 3  Anesthesia Plan: General and MAC   Post-op Pain Management: Minimal or no pain anticipated   Induction: Intravenous  PONV Risk Score and Plan: 2 and Propofol infusion and Treatment may vary due to age or medical condition  Airway Management Planned: Nasal Cannula, Natural Airway and Simple Face Mask  Additional Equipment: None  Intra-op Plan:   Post-operative Plan:   Informed Consent: I have reviewed the patients History and Physical, chart, labs and discussed the procedure including the risks, benefits and alternatives for the proposed anesthesia with the patient or authorized representative who has indicated his/her understanding and acceptance.     Dental advisory given  Plan Discussed with: CRNA  Anesthesia Plan Comments:          Anesthesia Quick Evaluation

## 2024-06-14 NOTE — Progress Notes (Addendum)
 Progress Note  Patient Name: Jay Ware Date of Encounter: 06/14/2024 [facility] Cardiologist: [name], MD   Interval Summary    Underwent TEE cardioversion this morning with successful conversion to normal sinus rhythm.  I followed up postprocedure and upon ambulating back to his bed he converted back to asymptomatic atrial fibrillation with RVR.  He is shortness of breath and swelling is much improved and was hoping to be discharged today.  Vital Signs Vitals:   06/14/24 1000 06/14/24 1020 06/14/24 1025 06/14/24 1147  BP: 102/71 (!) 131/93 (!) 112/90 129/87  Pulse: 85 86 86 (!) 118  Resp: 14 20 19    Temp:      TempSrc:      SpO2: 95% 95% 94%   Weight:      Height:        Intake/Output Summary (Last 24 hours) at 06/14/2024 1330 Last data filed at 06/14/2024 9061 Gross per 24 hour  Intake --  Output 800 ml  Net -800 ml      06/14/2024    4:56 AM 06/13/2024    3:19 AM 06/12/2024    1:32 PM  Last 3 Weights  Weight (lbs) 298 lb 11.2 oz 297 lb 6.4 oz 308 lb 10.3 oz  Weight (kg) 135.489 kg 134.9 kg 140 kg     Telemetry/ECG  A-Fib with HR 100-110s - Personally Reviewed  Physical Exam Vitals and nursing note reviewed.  Constitutional:      Appearance: Normal appearance.  HENT:     Head: Normocephalic and atraumatic.  Eyes:     Conjunctiva/sclera: Conjunctivae normal.  Cardiovascular:     Rate and Rhythm: Tachycardia present. Rhythm irregular.  Pulmonary:     Effort: Pulmonary effort is normal.     Breath sounds: Normal breath sounds.  Musculoskeletal:        General: No swelling or tenderness.  Skin:    Coloration: Skin is not jaundiced or pale.  Neurological:     Mental Status: He is alert.      Assessment & Plan   Jay Ware is a 63 y.o. male with a hx of CAD c/b NSTEMI s/p PCI (2000 and 2023), PAF, PAD s/p Fem artery stent (2011), prior tobacco use w/ active vaping, morbid obesity, OSA (not on CPAP), CKD 2, TAA, HTN,  HLD, and ischemic cardiomyopathy (EF in 2023 60-65%)  who is being seen for atrial fibrillation    Paroxysmal atrial fibrillation with RVR Underwent TEE cardioversion 06/14/2024 with initially successful conversion to normal sinus rhythm but soon after converted back to A-fib with RVR Patient reports noncompliance with medications prior to admission TSH normal, electrolytes normal Previously on IV diltiazem , holding given reduced EF  IV amiodarone ordered this morning prior to his procedure but he has not received this yet.  Can start the Amio load IV followed by oral 400 mg twice daily x 1 week followed by 200 mg daily.  If he leaves AMA then would start the patient on the amio regimen outlined above.  Hopefully the patient will cardiovert on this regimen however he may require outpatient cardioversion in 1 month if he remains in A-fib Start on metoprolol  succinate 50 mg this a.m. Continue Eliquis  5 mg BID I had an extensive discussion with the patient regarding the plan as outlined above earlier today and that he would need to stay if we can't get his HR under control. He expressed understanding.    Acute on chronic CHF with newly reduced EF,  25-30%, resolved Hypertension Echo this admission: LVEF 25-30%, global hypokinesis, mld LVH, normal RV systolic function, normal IVC CT chest showed trace bilateral pleural effusions, R > L Given IV Lasix  80 mg x 2 Net - 3L this admission with only 1L out yesterday - euvolemic today Creatinine peaked this AM 1.45 ? 1.71 Normotensive last 24-48hr Weight 308 lb ? 298 lb  Patient reports good urine output and improvement in symptoms  Previously on IV diltiazem , holding given reduced EF  Holding off on additional diuresis at this time  Continue to monitor daily weights, strict I's and O's, daily BMPs Will add on GDMT post-DCCV as tolerated with BP and renal function    CAD s/p PCI to RPDA 2000, to OM1 2023  Hyperlipidemia PAD s/p femoral angioplasty  in 2011 Patient denied any chest pain 06/13/2024: ALT 67; HDL 38; LDL Cholesterol 65  Continue Lipitor  80 mg Continue Zetia  10 mg No long-term antiplatelet therapy with Eliquis  use   Thoracic aortic aneurysm CTA chest this admission showed: Stable 4.4 cm ascending thoracic aortic aneurysm, no evidence of dissection Continue to monitor annually    For questions or updates, please contact [facility] Please consult [email or web address] for contact info under       Signed, Emeline FORBES Calender, DO

## 2024-07-10 NOTE — Progress Notes (Incomplete)
 " Cardiology Office Note:   Date:  07/10/2024  ID:  CARVER MURAKAMI, DOB 19-Jun-1961, MRN [id number] PCP: Bobbette Coye LABOR, MD  [facility] Providers Cardiologist:  [name], MD { Chief Complaint: No chief complaint on file.     History of Present Illness:   Jay Ware is a 64 y.o. male with a PMH of CAD c/b NSTEMI s/p PCI (2000 and 2023), HFrEF (EF = 25-30%), PAF (on Eliquis ), PAD s/p Fem artery stent (2011), prior tobacco use w/ active vaping, morbid obesity, OSA (not on CPAP), CKD 2, TAA, HTN, HLD who presents for follow up.  The patient was recently hospitalized from 12/10-12/12/25 for symptomatic atrial fibrillation with RVR and acute systolic dysfunction.  I performed a TEE cardioversion on the patient, but unfortunately he reverted back to atrial fibrillation shortly thereafter.  He was subsequently loaded with oral amiodarone  and metoprolol  succinate for rhythm/rate control.  During the hospitalization his LVEF was found to be severely reduced to 25 to 30%.  He presents today for follow-up.   Past Medical History:  Diagnosis Date   A-fib [facility])    CAD (coronary artery disease)    Hypertension      Studies Reviewed:    EKG: ***       Cardiac Studies & Procedures   ______________________________________________________________________________________________ CARDIAC CATHETERIZATION  CARDIAC CATHETERIZATION 06/18/2022  Conclusion Conclusions: Severe single-vessel coronary artery disease with thrombotic occlusion of moderate-caliber OM1 branch.  Otherwise, there is mild, nonobstructive coronary artery disease involving the LAD and RCA. Widely patent RPDA stent. Upper normal left ventricular filling pressure (LVEDP 15 mmHg). Severe tortuosity of distal left radial artery, successfully navigated with considerable difficulty.  Consider alternative approach for future catheterizations. Successful PCI to OM1 using Synergy 2.5 x 24 mm drug-eluting stent  (postdilated to 2.8 mm) with 0% residual stenosis and TIMI-3 flow.  Recommendations: Aggressive secondary prevention of coronary artery disease. Continue dual antiplatelet therapy with aspirin  and clopidogrel .  If no evidence of bleeding/vascular injury, apixaban  can be restarted as soon as tomorrow.  I recommend aspirin  + clopidogrel  + apixaban  for 1 week, after which time aspirin  can be discontinued and clopidogrel  + apixaban  continued for at least 12 months. Gentle post catheterization hydration. Follow-up echocardiogram.  Lonni Hanson, MD [facility]  Findings Coronary Findings Diagnostic  Dominance: Right  Left Main Vessel is large.  Left Anterior Descending Vessel is large. Mid LAD lesion is 20% stenosed.  First Diagonal Branch Vessel is small in size.  Second Diagonal Branch Vessel is large in size.  Third Diagonal Branch Vessel is small in size. There is mild disease in the vessel.  Left Circumflex Vessel is large.  First Obtuse Marginal Branch Vessel is moderate in size. 1st Mrg lesion is 100% stenosed. The lesion is thrombotic.  Second Obtuse Marginal Branch Vessel is moderate in size.  Right Coronary Artery Vessel is large. There is mild diffuse disease throughout the vessel.  Right Posterior Descending Artery Vessel is large in size. Previously placed RPDA stent of unknown type is  widely patent. Previously placed stent displays no restenosis.  Right Posterior Atrioventricular Artery Vessel is large in size. RPAV lesion is 30% stenosed.  First Right Posterolateral Branch Vessel is large in size.  Second Right Posterolateral Branch Vessel is moderate in size.  Intervention  1st Mrg lesion Stent Lesion length:  20 mm. CATH LAUNCHER 6FR EBU 4 guide catheter was inserted. Lesion crossed with guidewire using a WIRE RUNTHROUGH IZANAI 014 180. Pre-stent angioplasty was  performed using a BALLN EMERGE MR 2.0X12. Maximum pressure:  6 atm. A  drug-eluting stent was successfully placed using a SYNERGY XD 2.50X24. Maximum pressure: 16 atm. Stent strut is well apposed. Post-stent angioplasty was performed using a BALLN [place] EMERGE MR [id number]. Maximum pressure:  16 atm. Post-Intervention Lesion Assessment The intervention was successful. Pre-interventional TIMI flow is 0. Post-intervention TIMI flow is 3. No complications occurred at this lesion. There is a 0% residual stenosis post intervention.     ECHOCARDIOGRAM  ECHOCARDIOGRAM COMPLETE 06/13/2024  Narrative ECHOCARDIOGRAM REPORT    Patient Name:   Jay Ware [facility] Date of Exam: 06/13/2024 Medical Rec #:  [id number]       Height:       73.0 in Accession #:    [id number]      Weight:       297.4 lb Date of Birth:  [birth year]      BSA:          2.547 m Patient Age:    63 years        BP:           110/89 mmHg Patient Gender: M               HR:           105 bpm. Exam Location:  Inpatient  Procedure: 2D Echo, Cardiac Doppler and Color Doppler (Both Spectral and Color Flow Doppler were utilized during procedure).  Indications:    Atrial Fibrilliation, Dissection of thoracic aorta  History:        Patient has prior history of Echocardiogram examinations, most recent 06/18/2022. CAD and Previous Myocardial Infarction, Arrythmias:Atrial Fibrillation; Risk Factors:Hypertension, Dyslipidemia, Sleep Apnea and Former Smoker.  Sonographer:    Juliene Rucks Referring Phys: [phone] SEYED A SHAHMEHDI   Sonographer Comments: Patient is obese and Technically difficult study due to poor echo windows. Image acquisition challenging due to uncooperative patient, Image acquisition challenging due to patient body habitus and Image acquisition challenging due to respiratory motion. IMPRESSIONS   1. Poor endocardial visualization no definity  given . Left ventricular ejection fraction, by estimation, is 25 to 30%. The left ventricle has severely decreased function. The left ventricle  demonstrates global hypokinesis. The left ventricular internal cavity size was moderately dilated. There is mild left ventricular hypertrophy. Left ventricular diastolic parameters are indeterminate. 2. Right ventricular systolic function is normal. The right ventricular size is normal. 3. The mitral valve is abnormal. Trivial mitral valve regurgitation. No evidence of mitral stenosis. 4. The aortic valve is normal in structure. Aortic valve regurgitation is not visualized. No aortic stenosis is present. 5. Aortic dilatation noted. There is mild dilatation of the aortic root, measuring 38 mm. 6. The inferior vena cava is normal in size with greater than 50% respiratory variability, suggesting right atrial pressure of 3 mmHg.  FINDINGS Left Ventricle: Poor endocardial visualization no definity  given. Left ventricular ejection fraction, by estimation, is 25 to 30%. The left ventricle has severely decreased function. The left ventricle demonstrates global hypokinesis. Strain was performed and the global longitudinal strain is indeterminate. The left ventricular internal cavity size was moderately dilated. There is mild left ventricular hypertrophy. Left ventricular diastolic parameters are indeterminate.  Right Ventricle: The right ventricular size is normal. No increase in right ventricular wall thickness. Right ventricular systolic function is normal.  Left Atrium: Left atrial size was normal in size.  Right Atrium: Right atrial size was normal in size.  Pericardium: Trivial pericardial effusion is present. The  pericardial effusion is anterior to the right ventricle.  Mitral Valve: The mitral valve is abnormal. There is mild thickening of the mitral valve leaflet(s). There is mild calcification of the mitral valve leaflet(s). Trivial mitral valve regurgitation. No evidence of mitral valve stenosis.  Tricuspid Valve: The tricuspid valve is normal in structure. Tricuspid valve regurgitation is not  demonstrated. No evidence of tricuspid stenosis.  Aortic Valve: The aortic valve is normal in structure. Aortic valve regurgitation is not visualized. No aortic stenosis is present.  Pulmonic Valve: The pulmonic valve was normal in structure. Pulmonic valve regurgitation is not visualized. No evidence of pulmonic stenosis.  Aorta: Aortic dilatation noted. There is mild dilatation of the aortic root, measuring 38 mm.  Venous: The inferior vena cava is normal in size with greater than 50% respiratory variability, suggesting right atrial pressure of 3 mmHg.  IAS/Shunts: No atrial level shunt detected by color flow Doppler.  Additional Comments: 3D was performed not requiring image post processing on an independent workstation and was indeterminate.   LEFT VENTRICLE PLAX 2D LVIDd:         6.00 cm      Diastology LVIDs:         5.30 cm      LV e' medial:  9.03 cm/s LV PW:         1.30 cm      LV e' lateral: 13.40 cm/s LV IVS:        1.20 cm LVOT diam:     2.10 cm LV SV:         55 LV SV Index:   22 LVOT Area:     3.46 cm  LV Volumes (MOD) LV vol d, MOD A4C: 282.0 ml LV vol s, MOD A4C: 202.0 ml LV SV MOD A4C:     282.0 ml  RIGHT VENTRICLE RV Basal diam:  3.20 cm RV Mid diam:    2.30 cm  LEFT ATRIUM           Index        RIGHT ATRIUM           Index LA diam:      3.00 cm 1.18 cm/m   RA Area:     18.10 cm LA Vol (A2C): 44.3 ml 17.39 ml/m  RA Volume:   49.90 ml  19.59 ml/m LA Vol (A4C): 56.7 ml 22.26 ml/m AORTIC VALVE LVOT Vmax:   108.00 cm/s LVOT Vmean:  70.600 cm/s LVOT VTI:    0.160 m  AORTA Ao Root diam: 3.80 cm   SHUNTS Systemic VTI:  0.16 m Systemic Diam: 2.10 cm  Maude Emmer MD Electronically signed by Maude Emmer MD Signature Date/Time: 06/13/2024/11:08:00 AM    Final   TEE  ECHO TEE 06/14/2024  Narrative TRANSESOPHOGEAL ECHO REPORT    Patient Name:   ISHMEL ACEVEDO [facility] Date of Exam: 06/14/2024 Medical Rec #:  [id number]       Height:        73.0 in Accession #:    [id number]      Weight:       298.7 lb Date of Birth:  1960/11/26      BSA:          2.552 m Patient Age:    63 years        BP:           132/97 mmHg Patient Gender: M  HR:           110 bpm. Exam Location:  Inpatient  Procedure: Transesophageal Echo and Color Doppler (Both Spectral and Color Flow Doppler were utilized during procedure).  Indications:     Atrial fibrillation  History:         Patient has prior history of Echocardiogram examinations, most recent 06/13/2024. CAD, PAD, Arrythmias:Atrial Fibrillation; Risk Factors:Hypertension, Dyslipidemia and Sleep Apnea. CKD.  Sonographer:     Philomena Daring Referring Phys:  [id number] TAYLOR A PARCELLS Diagnosing Phys: [name]  PROCEDURE: After discussion of the risks and benefits of a TEE, an informed consent was obtained from the patient. The transesophogeal probe was passed without difficulty through the esophogus of the patient. Imaged were obtained with the patient in a left lateral decubitus position. Sedation performed by different physician. The patient was monitored while under deep sedation. Anesthestetic sedation was provided intravenously by Anesthesiology: 150mg  of Propofol , 100mg  of Lidocaine . The patient developed no complications during the procedure. A successful direct current cardioversion was performed at 300 joules with 1 attempt.  IMPRESSIONS   1. Limited TEE performed to rule out LAA thrombus. The TEE was limited due to the patient's untreated OSA and risk for respiratory compromise. Left ventricular ejection fraction, by estimation, is 25 to 30%. The left ventricle has severely decreased function. The left ventricle demonstrates global hypokinesis. The left ventricular internal cavity size was moderately dilated. 2. Right ventricular systolic function is moderately reduced. The right ventricular size is normal. 3. Left atrial size was mildly dilated. No left atrial/left  atrial appendage thrombus was detected. 4. A small pericardial effusion is present. 5. The mitral valve is normal in structure. Trivial mitral valve regurgitation. 6. The aortic valve was not assessed.  Conclusion(s)/Recommendation(s): No LA/LAA thrombus identified. Successful cardioversion performed with restoration of normal sinus rhythm.  FINDINGS Left Ventricle: Limited TEE performed to rule out LAA thrombus. The TEE was limited due to the patient's untreated OSA and risk for respiratory compromise. Left ventricular ejection fraction, by estimation, is 25 to 30%. The left ventricle has severely decreased function. The left ventricle demonstrates global hypokinesis. The left ventricular internal cavity size was moderately dilated.  Right Ventricle: The right ventricular size is normal. No increase in right ventricular wall thickness. Right ventricular systolic function is moderately reduced.  Left Atrium: Left atrial size was mildly dilated. No left atrial/left atrial appendage thrombus was detected.  Right Atrium: Right atrial size was normal in size.  Pericardium: A small pericardial effusion is present.  Mitral Valve: The mitral valve is normal in structure. Trivial mitral valve regurgitation.  Tricuspid Valve: The tricuspid valve is not assessed.  Aortic Valve: The aortic valve was not assessed.  Pulmonic Valve: The pulmonic valve was not assessed.  Aorta: Aortic root could not be assessed.  IAS/Shunts: The interatrial septum was not assessed.  [name] Electronically signed by [name] Signature Date/Time: 06/14/2024/2:18:45 PM    Final        ______________________________________________________________________________________________      Risk Assessment/Calculations:   {Does this patient have ATRIAL FIBRILLATION?[phone]} No BP recorded.  {Refresh Note OR Click here to enter BP  :1}***        Physical Exam:     VS:  There were no  vitals taken for this visit. ***    Wt Readings from Last 3 Encounters:  06/14/24 298 lb 11.2 oz (135.5 kg)  06/19/22 (!) 308 lb 13.8 oz (140.1 kg)  04/20/22 (!) 310 lb (140.6 kg)  GEN: Well nourished, well developed, in no acute distress NECK: No JVD; No carotid bruits CARDIAC: ***RRR, no murmurs, rubs, gallops RESPIRATORY:  Clear to auscultation without rales, wheezing or rhonchi  ABDOMEN: Soft, non-tender, non-distended, normal bowel sounds EXTREMITIES:  Warm and well perfused, no edema; No deformity, 2+ radial pulses PSYCH: Normal mood and affect   Assessment & Plan   #CAD c/b NSTEMI s/p Multiple PCIs  #PAF s/p DCCV 06/14/24  #Acute HFrEF (EF = 25-30%) - Has previously had mildly reduced LVEF that is now severely reduced in the setting of atrial fibrillation RVR. - Suspect that this is likely tachycardia induced.  #PAD s/p Fem Artery Stent (2011)   #TAA   #HTN   #HLD   #CKD     {Are you ordering a CV Procedure (e.g. stress test, cath, DCCV, TEE, etc)?   Press F2        :[id number]}   This note was written with the assistance of a dictation microphone or AI dictation software. Please excuse any typos or grammatical errors.   Signed, [name], MD 07/10/2024 9:41 PM    [facility]  "

## 2024-07-11 ENCOUNTER — Ambulatory Visit: Admitting: Student in an Organized Health Care Education/Training Program

## 2024-07-22 ENCOUNTER — Other Ambulatory Visit (HOSPITAL_COMMUNITY): Payer: Self-pay

## 2024-07-26 ENCOUNTER — Telehealth: Payer: Self-pay | Admitting: Student in an Organized Health Care Education/Training Program

## 2024-07-26 NOTE — Telephone Encounter (Signed)
 I received notice that the patient has a question regarding a tooth that he lost and if it is related to a TEE/cardioversion procedure that was performed on 06/14/24. The patient's teeth were intact after the procedure was performed. I called the patient on 07/25/24 and tried again twice today to discuss his concerns but he did not answer. I tried calling his wife who is his emergency contact yesterday on 07/25/24 as well without an answer. I will be happy to discuss any concerns that he may have if he decided to call bac.  Eisa Conaway T. Floretta HEATH, MD [place]  [facility]  07/26/2024 3:43 PM

## 2024-08-02 ENCOUNTER — Ambulatory Visit: Admitting: Student in an Organized Health Care Education/Training Program

## 2024-09-02 ENCOUNTER — Ambulatory Visit: Admitting: Student in an Organized Health Care Education/Training Program
